# Patient Record
Sex: Male | Born: 1935
Health system: Southern US, Community
[De-identification: ages and names within clinical notes are randomized; demographics above are authoritative.]

## PROBLEM LIST (undated history)

## (undated) DIAGNOSIS — K921 Melena: Secondary | ICD-10-CM

## (undated) DIAGNOSIS — N059 Unspecified nephritic syndrome with unspecified morphologic changes: Secondary | ICD-10-CM

## (undated) DIAGNOSIS — F1010 Alcohol abuse, uncomplicated: Secondary | ICD-10-CM

## (undated) DIAGNOSIS — K635 Polyp of colon: Secondary | ICD-10-CM

## (undated) HISTORY — DX: Polyp of colon: K63.5

## (undated) HISTORY — PX: LIGAMENT REPAIR: SHX5444

## (undated) HISTORY — DX: Unspecified nephritic syndrome with unspecified morphologic changes: N05.9

## (undated) HISTORY — DX: Melena: K92.1

## (undated) HISTORY — PX: KNEE ARTHROSCOPY W/ MENISCAL REPAIR: SHX1877

## (undated) HISTORY — DX: Alcohol abuse, uncomplicated: F10.10

## (undated) HISTORY — PX: OTHER SURGICAL HISTORY: SHX169

---

## 1998-06-18 ENCOUNTER — Ambulatory Visit (HOSPITAL_COMMUNITY): Admission: RE | Admit: 1998-06-18 | Discharge: 1998-06-18 | Payer: Self-pay | Admitting: *Deleted

## 1999-05-30 HISTORY — PX: INGUINAL HERNIA REPAIR: SHX194

## 1999-08-13 ENCOUNTER — Inpatient Hospital Stay (HOSPITAL_COMMUNITY): Admission: EM | Admit: 1999-08-13 | Discharge: 1999-08-19 | Payer: Self-pay | Admitting: *Deleted

## 2000-10-10 ENCOUNTER — Ambulatory Visit (HOSPITAL_BASED_OUTPATIENT_CLINIC_OR_DEPARTMENT_OTHER): Admission: RE | Admit: 2000-10-10 | Discharge: 2000-10-10 | Payer: Self-pay | Admitting: General Surgery

## 2001-05-02 ENCOUNTER — Ambulatory Visit (HOSPITAL_COMMUNITY): Admission: RE | Admit: 2001-05-02 | Discharge: 2001-05-02 | Payer: Self-pay | Admitting: *Deleted

## 2001-05-02 ENCOUNTER — Encounter (INDEPENDENT_AMBULATORY_CARE_PROVIDER_SITE_OTHER): Payer: Self-pay | Admitting: Specialist

## 2003-08-13 ENCOUNTER — Ambulatory Visit (HOSPITAL_COMMUNITY): Admission: RE | Admit: 2003-08-13 | Discharge: 2003-08-13 | Payer: Self-pay | Admitting: Orthopedic Surgery

## 2004-01-01 ENCOUNTER — Encounter: Admission: RE | Admit: 2004-01-01 | Discharge: 2004-01-01 | Payer: Self-pay | Admitting: Internal Medicine

## 2004-06-16 ENCOUNTER — Encounter: Admission: RE | Admit: 2004-06-16 | Discharge: 2004-06-16 | Payer: Self-pay | Admitting: General Surgery

## 2004-06-22 ENCOUNTER — Encounter (INDEPENDENT_AMBULATORY_CARE_PROVIDER_SITE_OTHER): Payer: Self-pay | Admitting: *Deleted

## 2004-06-22 ENCOUNTER — Ambulatory Visit (HOSPITAL_BASED_OUTPATIENT_CLINIC_OR_DEPARTMENT_OTHER): Admission: RE | Admit: 2004-06-22 | Discharge: 2004-06-22 | Payer: Self-pay | Admitting: General Surgery

## 2004-06-22 ENCOUNTER — Ambulatory Visit (HOSPITAL_COMMUNITY): Admission: RE | Admit: 2004-06-22 | Discharge: 2004-06-22 | Payer: Self-pay | Admitting: General Surgery

## 2005-05-29 HISTORY — PX: ROTATOR CUFF REPAIR: SHX139

## 2005-07-27 DIAGNOSIS — K635 Polyp of colon: Secondary | ICD-10-CM

## 2005-07-27 HISTORY — DX: Polyp of colon: K63.5

## 2006-08-22 ENCOUNTER — Ambulatory Visit: Payer: Self-pay | Admitting: Internal Medicine

## 2006-09-10 ENCOUNTER — Ambulatory Visit: Payer: Self-pay | Admitting: Internal Medicine

## 2007-02-27 ENCOUNTER — Ambulatory Visit: Payer: Self-pay | Admitting: Internal Medicine

## 2007-06-24 ENCOUNTER — Encounter: Payer: Self-pay | Admitting: Internal Medicine

## 2007-06-24 DIAGNOSIS — Z860100 Personal history of colon polyps, unspecified: Secondary | ICD-10-CM | POA: Insufficient documentation

## 2007-06-24 DIAGNOSIS — L0591 Pilonidal cyst without abscess: Secondary | ICD-10-CM | POA: Insufficient documentation

## 2007-09-11 ENCOUNTER — Ambulatory Visit: Payer: Self-pay | Admitting: Internal Medicine

## 2008-02-21 ENCOUNTER — Ambulatory Visit: Payer: Self-pay | Admitting: Internal Medicine

## 2008-05-27 ENCOUNTER — Encounter: Payer: Self-pay | Admitting: Internal Medicine

## 2008-09-21 ENCOUNTER — Ambulatory Visit: Payer: Self-pay | Admitting: Internal Medicine

## 2008-09-21 DIAGNOSIS — R972 Elevated prostate specific antigen [PSA]: Secondary | ICD-10-CM | POA: Insufficient documentation

## 2008-09-21 LAB — CONVERTED CEMR LAB: PSA, Free: 0.7 ng/mL

## 2008-09-26 ENCOUNTER — Encounter: Payer: Self-pay | Admitting: Internal Medicine

## 2009-01-27 ENCOUNTER — Encounter: Payer: Self-pay | Admitting: Internal Medicine

## 2009-03-09 ENCOUNTER — Ambulatory Visit: Payer: Self-pay | Admitting: Internal Medicine

## 2009-03-11 ENCOUNTER — Ambulatory Visit: Payer: Self-pay | Admitting: Internal Medicine

## 2009-03-11 ENCOUNTER — Telehealth (INDEPENDENT_AMBULATORY_CARE_PROVIDER_SITE_OTHER): Payer: Self-pay | Admitting: *Deleted

## 2009-05-07 ENCOUNTER — Encounter: Payer: Self-pay | Admitting: Internal Medicine

## 2009-09-22 ENCOUNTER — Ambulatory Visit: Payer: Self-pay | Admitting: Internal Medicine

## 2009-09-22 DIAGNOSIS — IMO0002 Reserved for concepts with insufficient information to code with codable children: Secondary | ICD-10-CM | POA: Insufficient documentation

## 2009-09-24 ENCOUNTER — Telehealth: Payer: Self-pay | Admitting: Internal Medicine

## 2009-09-27 ENCOUNTER — Ambulatory Visit: Payer: Self-pay | Admitting: Internal Medicine

## 2010-02-18 ENCOUNTER — Ambulatory Visit: Payer: Self-pay | Admitting: Internal Medicine

## 2010-06-28 NOTE — Progress Notes (Signed)
    Immunizations Administered:  Tetanus Vaccine:    Vaccine Type: Td    Site: right deltoid    Mfr: Sanofi Pasteur    Dose: 0.5 ml    Route: IM    Given by: Ami Bullins CMA    Exp. Date: 06/11/2011    Lot #: [id number]    VIS given: 04/16/07 version given September 24, 2009.

## 2010-06-28 NOTE — Assessment & Plan Note (Signed)
Summary: FLU / MEN /NWS   Nurse Visit   Allergies: No Known Drug Allergies  Orders Added: 1)  Flu Vaccine 3yrs + MEDICARE PATIENTS [Q2039] 2)  Administration Flu vaccine - MCR [G0008]      Flu Vaccine Consent Questions     Do you have a history of severe allergic reactions to this vaccine? no    Any prior history of allergic reactions to egg and/or gelatin? no    Do you have a sensitivity to the preservative Thimersol? no    Do you have a past history of Guillan-Barre Syndrome? no    Do you currently have an acute febrile illness? no    Have you ever had a severe reaction to latex? no    Vaccine information given and explained to patient? yes    Are you currently pregnant? no    Lot Number:[id number]   Exp Date:11/26/2010   Site Given  Left Deltoid IMu  

## 2010-06-28 NOTE — Assessment & Plan Note (Signed)
Summary: YEARLY FU/ MAY NEED RX TO GO ELSE WHERE FOR LABS/NWS  #   Vital Signs:  Patient profile:   76 year old male Height:      67 inches Weight:      180 pounds BMI:     28.29 O2 Sat:      96 % on Room air Temp:     98.1 degrees F oral Pulse rate:   66 / minute BP sitting:   128 / 80  (left arm) Cuff size:   large  Vitals Entered By: Bill Salinas CMA (September 22, 2009 10:03 AM)  O2 Flow:  Room air CC: pt here for cpx, Pt states he had pneumonia shot about 8 years ago. Pt is due for tetanus shot and has never had shingles vaccine/ ab  Vision Screening:      Vision Comments: Eye exam at the begining of April With Dr  Nile Riggs normal exam and repeat eye exam in 1 year/ Ami Bullins CMA  September 22, 2009 10:05 AM    Primary Care Provider:  Norins  CC:  pt here for cpx and Pt states he had pneumonia shot about 8 years ago. Pt is due for tetanus shot and has never had shingles vaccine/ ab.  History of Present Illness: Patinet presents for routine follow-up. He did have lab work at the [place] in December '10 - results reviewed - all normal. IN the interval he sustained tear of medial meniscus right knee requiring arthroscopic repair by Dr. Yisroel Ramming September '10. He has done well, doing his own physical therapy. He did not resume golf until December. He has otherwise been doing well.   Current Medications (verified): 1)  Multivitamins   Tabs (Multiple Vitamin) .... Take 1 By Mouth Qd 2)  Adult Aspirin Ec Low Strength 81 Mg  Tbec (Aspirin) .... 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 Also s/p beast cancer '07.  Risk Factors: Alcohol Use: 0 (09/21/2008) Caffeine Use: 3 cups/day (09/21/2008) Diet: heart healthy (09/21/2008) Exercise: yes (09/21/2008)  Risk Factors: Smoking Status: never (09/21/2008) Passive Smoke Exposure: no (Oct 02, 2007)  Social History: [employer] BS degree. 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 Also s/p beast cancer '07.  Review of Systems  The patient denies anorexia, fever, weight loss, weight gain, vision loss, hoarseness, syncope, peripheral edema, headaches, abdominal pain, and melena.    Physical Exam  General:  WNWD very trim appearing white male looking younger than his stated age. Head:  Normocephalic and atraumatic without obvious abnormalities. No apparent alopecia or balding. Eyes:  No corneal or conjunctival inflammation noted. EOMI. Perrla. Funduscopic exam benign, without hemorrhages, exudates  or papilledema. Vision grossly normal. Ears:  External ear exam shows no significant lesions or deformities.  Otoscopic examination reveals clear canals, tympanic membranes are intact bilaterally without bulging, retraction, inflammation or discharge. Hearing is grossly normal bilaterally. Nose:  no external deformity and no external erythema.   Mouth:  Oral mucosa and oropharynx without lesions or exudates.  Teeth in good repair. Neck:  No deformities, masses,  or tenderness noted.no thyromegaly and no carotid bruits.   Chest Wall:  No deformities, masses, tenderness or gynecomastia noted. Lungs:  Normal respiratory effort, chest expands symmetrically. Lungs are clear to auscultation, no crackles or wheezes. Heart:  Normal rate and regular rhythm. S1 and S2 normal without gallop, murmur, click, rub or other extra sounds. Abdomen:  soft, non-tender, normal bowel sounds, no guarding, no hepatomegaly, and no splenomegaly.   Rectal:  No external abnormalities noted. Normal sphincter tone. No rectal masses or tenderness. Genitalia:  Testes bilaterally descended without nodularity, tenderness or masses. No scrotal masses or lesions. No penis lesions or urethral discharge. Prostate:  Prostate gland firm and smooth, no enlargement, nodularity, tenderness, mass, asymmetry or induration. Msk:  normal ROM, no joint tenderness, no joint swelling, no joint warmth, no redness over joints, no joint deformities, and no joint instability.   Pulses:  2+ radial, femoral and DP pulses Extremities:  No clubbing, cyanosis, edema, or deformity noted with normal full range of motion of all joints.   Neurologic:  alert & oriented X3, cranial nerves II-XII intact, strength normal in all extremities, sensation intact to light touch, sensation intact to pinprick, gait normal, and DTRs symmetrical and normal.   Skin:  turgor normal, color normal, no rashes, and no suspicious lesions.   Cervical Nodes:  no anterior cervical adenopathy and no posterior cervical adenopathy.   Inguinal Nodes:  no R inguinal adenopathy and no L inguinal adenopathy.   Psych:  Oriented X3, memory intact for recent and remote, normally interactive, good eye contact, and not anxious appearing.     Impression & Recommendations:  Problem # 1:  Hx of TEAR MEDIAL CARTILAGE OR MENISCUS KNEE CURRENT (ICD-836.0) Complete recovery and back on the links.  Problem # 2:  Preventive Health Care  (ICD-V70.0) Unremarkable history with no illnes. Normal physical exam. He is current with colorectal and prostate cancer screening. His labs from the [place] reviewed: excellent with normal chemistry, blood sugar, LDL cholesterol 119, HDL choleterol in the 40's, PSA normal.   In summary - a very nice man, friend of Dr. Leitha Bleak, who is medical stable and doing well. He will return in 1 year or as needed.   Complete Medication List: 1)  Multivitamins Tabs (Multiple vitamin) .... Take 1 by mouth qd 2)  Adult Aspirin Ec Low Strength 81 Mg Tbec (Aspirin) .... Take 1 by mouth qd

## 2010-09-26 ENCOUNTER — Ambulatory Visit: Payer: Self-pay | Admitting: Internal Medicine

## 2010-10-03 ENCOUNTER — Encounter: Payer: Self-pay | Admitting: Internal Medicine

## 2010-10-05 ENCOUNTER — Ambulatory Visit (INDEPENDENT_AMBULATORY_CARE_PROVIDER_SITE_OTHER): Payer: Medicare HMO | Admitting: Internal Medicine

## 2010-10-05 ENCOUNTER — Encounter: Payer: Self-pay | Admitting: Internal Medicine

## 2010-10-05 VITALS — BP 120/62 | HR 97 | Temp 97.4°F | Wt 170.0 lb

## 2010-10-05 DIAGNOSIS — Z136 Encounter for screening for cardiovascular disorders: Secondary | ICD-10-CM

## 2010-10-05 NOTE — Progress Notes (Signed)
Subjective:    Patient ID: Christopher Dudley, male    DOB: December 11, 1934, 76 y.o.   MRN: [id number]  HPI  The patient is here for annual Medicare wellness examination and management of other chronic and acute problems. In the interval since his last visit he has been doing well. He did injure his back caring for his wife who had a pelvic fracture. He was attended by [facility] orthopedics: Meloxciam x 10 day  and PT. He continues to do exercises at home. He has not played golf since her fracture and his injury. He did have an annual exam at the [place] in December '11 including a prostate exam. He had full labs which by his report were normal (copy being searched).   The risk factors are reflected in the social history.  The roster of all physicians providing medical care to patient - is listed in the Snapshot section of the chart.  Activities of daily living:  The patient is 100% inedpendent in all ADLs: dressing, toileting, feeding as well as independent mobility  Home safety : The patient has smoke detectors in the home. They wear seatbelts. Firearms are present in the home, kept in a safe fashion. There is no violence in the home.   There is no risks for hepatitis, STDs or HIV. There is no   history of blood transfusion. They have no travel history to infectious disease endemic areas of the world.  The patient has seen their dentist in the last six month. They have seen their eye doctor in the last year. They deny any hearing difficulty and have not had audiologic testing in the last year.  They do not  have excessive sun exposure. Discussed the need for sun protection: hats, long sleeves and use of sunscreen if there is significant sun exposure.   Diet: the importance of a healthy diet is discussed. They do have a healthy (unhealthy-high fat/fast food) diet.  Exercise: he does work out and plays golf several times a week.   Past Medical History  Diagnosis Date  . Colonic polyp 3-07  . Blood in  stool     hx of  . Alcohol abuse     hx of  . 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 Inguinal hernia repair 2001    bilateral, left done in 2006  . Rotator cuff repair 07  . Pilondial cyst excision 76 yrs old   Family History  Problem Relation Age of Onset  . Cancer Mother     ovarian  . Coronary artery disease Father   . Heart attack Father   . Gallbladder disease Father   . Hypertension Father   . Cancer Father     prostate  . Diabetes Neg Hx    History   Social History  . Marital Status: Married    Spouse Name: N/A    Number of Children: N/A  . Years of Education: 16   Occupational History  .      retired   Social History Main Topics  . Smoking status: Never Smoker   . Smokeless tobacco: Never Used  . Alcohol Use: Yes  . Drug Use: No  . Sexually Active: Not Currently   Other Topics Concern  . Not on file   Social History Narrative   [employer] BS degree.  [employer] x 4 years.   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 married '64 -  1 year divorce; married '70.  1 son - premature, perinatal death.  Wife with progressive dementia/confusion. Also s/p beast cancer '07.        His wife has had a pubic ramus fracture as noted from which she is making a recovery.       Review of Systems Review of Systems  Constitutional:  Negative for fever, chills, activity change and unexpected weight change.  HENT:  Negative for hearing loss, ear pain, congestion, neck stiffness and postnasal drip.   Eyes: Negative for pain, discharge and visual disturbance.  Respiratory: Negative for chest tightness and wheezing.   Cardiovascular: Negative for chest pain and palpitations.       [No decreased exercise tolerance Gastrointestinal: [No change in bowel habit. No bloating or gas. No reflux or indigestion Genitourinary: Negative for urgency, frequency, flank pain and difficulty urinating.  Musculoskeletal: Negative for  myalgias, back pain, arthralgias and gait problem.  Neurological: Negative for dizziness, tremors, weakness and headaches.  Hematological: Negative for adenopathy.  Psychiatric/Behavioral: Negative for behavioral problems and dysphoric mood.       Objective:   Physical Exam Constitutional: He is oriented to person, place, and time. He appears well-developed and well-nourished.       Healthy appearing white male in no acute distress  HENT:  Head: Normocephalic and atraumatic.  Right Ear: External ear normal. EAC/TM nl, TM is thickened. Left Ear: External ear normal.  EAC/TM nl Nose: Nose normal.  Mouth/Throat: Oropharynx is clear and moist.  Eyes: Conjunctivae and EOM are normal. Pupils are equal, round, and reactive to light. Right eye exhibits no discharge. Left eye exhibits no discharge. No scleral icterus.  Neck: Normal range of motion. Neck supple. No JVD present. No tracheal deviation present. No thyromegaly present.  Cardiovascular: Normal rate, regular rhythm and normal heart sounds.  Exam reveals no gallop and no friction rub.   No murmur heard.      Quiet precordium. 2+ radial and DP pulses  Pulmonary/Chest: Effort normal. No respiratory distress. He has no wheezes. He has no rales. He exhibits no tenderness.       No chest wall deformity  Abdominal: Soft. Bowel sounds are normal. He exhibits no distension. There is no tenderness. There is no rebound and no guarding.       No heptosplenomegaly  Genitourinary: Deferred - normal PSA at [facility]     Musculoskeletal: Normal range of motion. He exhibits no edema and no tenderness.       Small and large joints without redness, synovial thickening or deformity. Full range of motion preserved about all small, median and large joints.  Lymphadenopathy:    He has no cervical adenopathy.  Neurological: He is alert and oriented to person, place, and time. He has normal reflexes. No cranial nerve deficit. Coordination normal.  Skin: Skin is  warm and dry. No rash noted. No erythema.  Psychiatric: He has a normal mood and affect. His behavior is normal. Thought content normal.  Mental status : oriented person place and context, normal cognitive function with no evidence of memory loss or impairment.         Assessment & Plan:  1. Health maintenance - interval history without serious medical injury and making a good recovery from  Low back strain. His physical exam is normal. Outside lab review: CBC nl; Glucose 102; Cr 0.8; Lipid - 189/51/119; A1C 5.6%; PSA 2.26; TSH 0.68. All results in normal range.  Last colonoscopy '06. Immunizations are current.  In summary - a very nice man who is medically stable and doing well. He is advised to continue his back exercise and to return to playing golf when stable.

## 2010-10-06 ENCOUNTER — Encounter: Payer: Self-pay | Admitting: Internal Medicine

## 2010-10-14 NOTE — Assessment & Plan Note (Signed)
[facility]                           PRIMARY CARE OFFICE NOTE   NAME:Dudley, Christopher                    MRN:          [id number]  DATE:08/22/2006                            DOB:          19-Apr-1935    Christopher Dudley is a 76 year old gentleman who presents to establish for  ongoing continuity care. He had been formally followed by Dr. Karilyn Cota,  who has closed his office and prior to that, by Dr. Shonna Chock.   The patient has no active or chief complaint at today's visit.   PAST SURGICAL HISTORY:  1. Pilonidal cyst surgery at age 65.  2. Herniorrhaphy in 2001.  3. Left herniorrhaphy in 2006.  4. Orthopedic surgical repair of a rotator cuff in 2007.   PAST MEDICAL HISTORY:  1. Usual childhood disease.  2. Bright's disease as a child.  3. History of blood in the stool.  4. History of colon polyp.  5. History of alcohol abuse with the patient's date of sobriety being      August 12, 1999, please see referenced records for Dr. Viviann Spare and      Dr. Karilyn Cota.   CURRENT MEDICATIONS:  1. Aspirin only.  2. Multivitamins.   HABITS:  Tobacco none. Alcohol none.   FAMILY HISTORY:  Father at age 12 of an MI. He has a history of gall  bladder disease. Mother died at age 20 of ovarian cancer. Father had  prostate cancer and hypertension. No family history for colon cancer,  lung cancer, or diabetes.   SOCIAL HISTORY:  The patient has a BS degree. He graduated from  [employer]. He served for four years in the [employer]. The patient  worked for [employer] for 34 years and was a Transport planner in  transportation. The patient was married for less than a year and  divorced. He has been married to his current wife for 38 years. He is  currently retired and reports that he is enjoying his retirement.   REVIEW OF SYSTEMS:  The patient had no fevers, sweats, chills, or other  constitutional problems. He has not had an eye exam for 18-24 months  and  is due for the same. There are no ENT or cardiovascular complaints. The  patient does have a cough.  GI: The patient does have occasional heartburn. He did have a  colonoscopy, most recently in March 2007 with diverticulosis, and non-  bleeding internal hemorrhoids. The patient does admit to nocturia x2 and  some mild BPH symptoms with a slowed stream.  There are no musculoskeletal, dermatologic, neurologic, or psychiatric  complaints.   PHYSICAL EXAMINATION:  VITAL SIGNS:  Temperature 97.2, blood pressure  123/80, pulse 62, weight 191.  GENERAL:  This is a well nourished gentleman in no acute distress. No  examination otherwise conducted.   LABORATORY DATA:  This patient to be stable with no active medical  problems. He has had recent laboratory by Dr. Karilyn Cota from 07/14/05 which  revealed a normal white count, hemoglobin 14.6 grams, PSA was 2.22,  thyroid function was normal with a TSH of 1.18. Chemistries were  unremarkable with a glucose of 110, creatinine 1.0, normal electrolytes  otherwise, and normal liver functions.   ASSESSMENT AND PLAN:  Christopher Dudley seems to be a healthy gentleman. He  would be due for annual laboratory. We would need to have a lipid panel.  He is current with colorectal cancer screening. The patient seems to  have made a good recovery from his rotator cuff surgery.   The patient is asked to return to see me for a consolidation visit in 2-  3 weeks.     Rosalyn Gess Norins, MD  Electronically Signed    MEN/MedQ  DD: 09/02/2006  DT: 09/03/2006  Job #: [id number]   cc:   Radene Gunning

## 2010-10-14 NOTE — Procedures (Signed)
[facility]  Patient:    Christopher Dudley, Christopher Dudley                    MRN: [id number] Proc. Date: 08/18/99 Adm. Date:  [id number] Attending:  Alva Garnet CC:         Merlene Laughter. Renae Gloss, M.D.             Charolett Bumpers III, M.D.                           Procedure Report  PROCEDURES: 1. Video upper endoscopy. 2. Video colonoscopy.  INDICATIONS:  A 76 year old male with post GI bleeding anemia and history of alcohol abuse.  PREPARATION:  He was n.p.o. since midnight, having been on clear liquid diet and having taken GoLYTELY prep a few days ago.  The mucosa was largely clean, but there was some liquid stool present, which had to be aspirated to clear the mucosa.  PREPROCEDURE SEDATION:  He received a total of 3.75 mg of droperidol, 50 mg of Demerol and 8 mg Versed intravenously.  In addition, his throat was anesthetized with Hurricaine spray and he was on 2 L of nasal cannula O2.  PROCEDURE:  The Olympus video upper endoscope was inserted via the mouth and advanced easily through the upper esophageal sphincter.  Intubation was then carried out well into the descending duodenum.  On withdrawal, the mucosa was carefully evaluated.  Descending duodenum and bulb appeared normal.  There was ild to moderate gastritis in the antrum and especially the fundus of the stomach, but also noted diffusely.  A CLOtest was taken from the antrum.  Retroflexed view of the GE junction did not demonstrate any varices or significant hiatal hernia. he esophagus was normal without any evidence of varices.  There was no sign of upper GI active bleeding.  At the conclusion of this procedure, the patients position  was reversed for procedure #2: video colonoscopy.  He required no additional sedation.  The Olympus video colonoscope was inserted via the rectum and advanced very easily all the way to the cecum.  Cecal landmarks were identified and photographed.   On withdrawal, the mucosa was carefully evaluated.  The entire right colon was normal.  There was no sign of any active bleeding.  There was mild left-sided diverticulosis.  At 35 cm was a 0.6 cm sessile polyp noted that was removed with electrosnare cautery.  There was no post polypectomy bleeding. The specimen was recovered for histologic evaluation.  The remainder of the sigmoid and retroflexed view of the rectum were unremarkable.  Patient tolerated both procedures well.  Pulse, blood pressure and oximetry testing were stable throughout.  He was observed in recovery for 45 minutes and discharged back to is hospital room.  IMPRESSION: 1. No signs of active bleeding. 2. Gastritis, which was likely secondary to alcohol abuse.  CLOtest was pending.    There was no sign of variceal bleeding. 3. Mild left-sided diverticulosis of the colon. 4. Small sigmoid polyp, which has been removed.  PLAN:  Further recommendation as per primary attending and Dr. Laural Benes.  I will  begin the patient on a regular diet. CLOtest will be reviewed. DD:  08/18/99 TD:  08/18/99 Job: [id number]

## 2010-10-14 NOTE — Op Note (Signed)
[place]  Patient:    Christopher Dudley, Christopher Dudley                    MRN: [id number] Adm. Date:  [id number] Attending:  Henrene Dodge CC:         Lilly Cove, M.D.   Operative Report  PREOPERATIVE DIAGNOSIS:  Right inguinal hernia.  POSTOPERATIVE DIAGNOSIS:  Right inguinal hernia direct with indirect component.  OPERATION PERFORMED:  SURGEON:  Anselm Pancoast. Zachery Dakins, M.D.  ASSISTANT:  Nurse.  ANESTHESIA:  Local with sedation.  INDICATIONS FOR PROCEDURE:  The patient is a 76 year old Caucasian male who was referred to me by Dr. Karilyn Cota whom he has recently started seeing for his medical care.  The patient stated that about two weeks ago after playing golf, he noticed a little pain while playing golf and then a little bulge in the right groin shortly afterwards and over the last several weeks this has increased in size so now he has sort of an egg-sized mass that I thought was a direct inguinal hernia.  The patient has no definite weakness on the left side and I recommended that we repair this with local and sedation.  The patient was taken to the operative suite, was given a gram of Kefzol.  An IV had been started.  He was given some sedation.  The right groin area was first clipped and then prepped with Betadine surgical solution and scrubbed and draped in a sterile manner.  The inguinal incision area was infiltrated with a mixture of 0.5% plain Xylocaine and 0.25% Marcaine with Adrenalin and the ilioinguinal nerve area was infiltrated with a blunted 21 gauge needle, all total of about 30 cc of solution used.   Sharp dissection of the skin and subcutaneous Scarpas superficial vein was clamped, divided and ligated with fine Vicryl. The external oblique was then opened through the directions of the fibers. The cord structures were elevated at the symphysis pubis and you could see that this was kind of a bulge coming out through the  internal ring but on removing the little lipoma first, then noted that the patient has a very thin recent hernia sac with a lot of kind of preperitoneal tissue kind of coming out through the area.  The preperitoneal tissue was separated from the hernia sac which had been opened, high sac ligation performed with a  2-0 Vicryl, a 2-0 Prolene and then the floor of the inguinal canal which was definitely giving way and this was basically kind of a direct type weakness and was repaired with sort of a Shouldice type reinforcement of the area with running 2-0 Prolene.  I then took a piece of Prolene mesh shaped like a sail, slit laterally and sutured it in, reinforcing the floor starting at the symphysis pubis with a running 2-0 Prolene, the two tails laterally sutured together. The superior flap was sutured with interrupted 2-0 Prolene sutures.  The ilioinguinal nerve had been protected and is not under excessive tension.  The cord structures were back in normal position and the new created internal ring should not cause any cord compression and the external oblique was closed with 3-0 Vicryl.  Scarpas fascia was closed with interrupted 3-0 Vicryl, 4-0 Vicryl suture subcuticular and benzoin and Steri-Strips on the skin.  The patient tolerated the procedure nicely and was sent to the recovery room in a stable postoperative condition. He will be released after a short stay in the recovery room.  DESCRIPTION OF PROCEDURE: DD:  10/10/00 TD:  10/10/00 Job: [id number]

## 2010-10-14 NOTE — Assessment & Plan Note (Signed)
[facility]                           PRIMARY CARE OFFICE NOTE   NAME:Christopher Dudley, Rewerts                    MRN:          [id number]  DATE:09/10/2006                            DOB:          [birth year]    Mr. Goth was seen as a new patient August 22, 2006.  Please see that  complete dictation.  This was reviewed with the patient in detail with  no addition to corrections.  Of note, on orthopedics, at the time of his  rotator cuff surgery, he was also noted to have a separated biceps  tendon on the right, which was not repairable.   REVIEW OF SYSTEMS:  Negative for constitutional, cardiovascular,  respiratory, GI or GU problems.   EXAMINATION:  Temperature was 96.9, blood pressure 136/81, pulse 63,  weight 192.  GENERAL APPEARANCE:  This is a well-nourished, athletic appearing  gentleman in no acute distress.  HEENT:  Normocephalic, atraumatic.  EACs and TMs were normal. Oropharynx  with native dentition and in good repair.  No buccal or palate lesions  were noted.  Posterior pharynx was clear.  Conjunctivae and sclerae were  clear.  PERRLA, EOMI.  Funduscopic exam was unremarkable by hand held  instrument.  NECK:  Was supple without thyromegaly.  NODES:  No adenopathy was noted in the cervical, supraclavicular  regions.  CHEST:  No CVA tenderness.  LUNGS:  Were clear to auscultation and percussion.  CARDIOVASCULAR:  2+ radial pulses, no JVD or carotid bruits.  He had a  quiet precordium with regular rate and rhythm without murmurs, rubs or  gallops.  ABDOMEN:  Soft, no guarding or rebound.  No organosplenomegaly was  appreciated.  GENITALIA:  Normal uncircumcised male phallus.  Bilaterally descended  testicles without masses.  RECTAL:  Normal sphincter tone was noted.  Prostate was smooth, round,  normal contour, generous in size, but no nodules or abnormalities  otherwise noted.  EXTREMITIES:  Without clubbing, cyanosis, edema, deformity.  NEUROLOGIC:  Exam was nonfocal.   The patient did have recent laboratory by Dr. Karilyn Cota and there is no  indication to repeat laboratory at this time.   ASSESSMENT AND PLAN:  This is a healthy gentleman who appears to be  medically stable at this time.  I have asked him to return to see me on  an as needed basis. He reports that he does occasionally attend he [facility], and I have asked if he has laboratory to have copies forwarded  to me.  The patient is asked to return to see me in 1 year or on an as  needed basis.    Rosalyn Gess Norins, MD  Electronically Signed   MEN/MedQ  DD: 09/10/2006  DT: 09/11/2006  Job #: [id number]

## 2010-10-14 NOTE — Op Note (Signed)
Christopher Dudley, Christopher Dudley             ACCOUNT NO.:  [id number]   MEDICAL RECORD NO.:  [id number]          PATIENT TYPE:  AMB   LOCATION:  [facility]                         FACILITY:  [facility]   PHYSICIAN:  Anselm Pancoast. Weatherly, M.D.DATE OF BIRTH:  Jan 19, 1935   DATE OF PROCEDURE:  06/22/2004  DATE OF DISCHARGE:                                 OPERATIVE REPORT   PREOPERATIVE DIAGNOSIS:  Left inguinal hernia, probably direct.   POSTOPERATIVE DIAGNOSIS:  Left indirect inguinal hernia.   ANESTHESIA:  Local anesthesia with MAC.   SURGEON:  Anselm Pancoast. Zachery Dakins, M.D.   HISTORY OF PRESENT ILLNESS:  Christopher Dudley is a 76 year old male who I  have repaired a previous right inguinal hernia years ago.  He now comes in  with a bulge in the left groin that has been increasing over a period of  approximately a year.  He had reduced this spontaneously and Dr. Karilyn Cota is  his regular physician and he would like to go ahead and get this repaired in  the winter as he plays an extensive amount of golf and it is painful at the  end of a round.  The patient preoperatively was placed in the OR table.  The  left area clipped and then prepped with Betadine and surgical solution and  draped in a sterile manner.  The inguinal incision area was infiltrated with  a mixture of 0.5% plain Marcaine and 0.5 Xylocaine with Adrenaline and then  the ileoinguinal nerve area was anesthetized with a blunted 22-gauge needle,  in all total about 30 cc of anesthetic solution used.  Sharp dissection  through the skin and subcutaneous tissue, the external oblique was opened  and the ileoinguinal nerve was identified and protected.  The cord  structures were elevated and this was noted to be a basically an indirect  hernia with kind of a weakness of the floor and I opened the cord structures  and separated some fatty tissue in the hernia sac.  The hernia was kind of  thickened and I was not sure at first whether this was actually a  bladder  diverticulum because of the thickness and I kind of carefully dissected down  through layers, opening into the hernia sac and then a high sac ligation was  performed.  The first stitch was placed with the sac opened and then I did a  second stitch around it and then was dissecting the hernia sac when I noted  that the vas was kind of caught up looped in this little where I went around  the hernia sac.  I removed the stitch, freed the hernia sac from the cord  structures and then resutured the high sac ligation under direct vision with  an 2-0 Surgilon.  Next, the floor where it was kind of weak inside the  direct was sutured with a running 2-0 Prolene, recreating the internal ring  and then going back and tying the two ends together.  Next, a piece of  Prolene mesh shaped like a sail was slit and placed around the internal ring  starting at the symphysis pubis suturing  the inferior rim with interrupted  sutures of, or a continuous suture of 2-0 Prolene and the two tails sutured  together laterally.  The superior flap was sutured down with interrupted  sutures of 2-0 Prolene.  The external oblique was closed with 2-0 Vicryl.  The cord structures in their normal position, the ileoinguinal nerve had  been protected.  The Scarpa's fascia was closed with interrupted 3-0 Vicryl,  4-0 Dexon subcuticular and Benzoin and Steri-Strips on the skin.  The  patient tolerated the procedure nicely.  I had placed a little anesthetic  agent in the  inguinal ligament for immediate postoperative pain and he will be released  after a short stay in the recovery room.  He will see [place] back in the office  in approximately a week.  If he is getting any testicular swelling, he will  be downed more.      WJW/MEDQ  D:  06/22/2004  T:  06/22/2004  Job:  [id number]   cc:   Wilson Singer, M.D.  [address]  [place]  [place] [address]  Fax: [phone]

## 2010-10-14 NOTE — Discharge Summary (Signed)
. [facility]  Patient:    Christopher Dudley, Christopher Dudley                    MRN: [id number] Adm. Date:  [id number] Disc. Date: [id number] Attending:  Alva Garnet CC:         Georg Ruddle. Viviann Spare, M.D.                           Discharge Summary  DISCHARGE DIAGNOSES: 1. Rectal bleeding secondary to colonic polyps, diverticulosis. 2. Gastritis. 3. Alcohol withdrawal/delirium tremens. 4. Elevated liver function tests. 5. Hyperglycemia. 6. Acute blood loss anemia.  CONSULTANTS:  Verlin Grills, M.D., gastroenterologist.  PROCEDURES:  Esophagogastroduodenoscopy and colonoscopy.  HOSPITAL COURSE: #1 - RECTAL BLEEDING:  [name] presented after several episodes of acute bleeding with grossly bloody stools.  He had denied any abdominal pain, diarrhea, nausea, vomiting.  He was awaiting a colonoscopy and EGD over the weekend; however, on day #2 of admission, he started having alcohol withdrawal symptoms.  These symptoms included severe agitation elevated blood pressure, tachycardia, diaphoresis, delirium, and combativeness.  He required a leather four-point restraints and assistance from security to appropriately restrain him so that he would not hurt himself or others on the floor.  After 36 hours of a Librium taper and Ativan p.r.n., he returned to his baseline mental status and an EGD and colonoscopy was performed.  An EGD showed mild gastritis with no varices and a colonoscopy showed a mild left diverticulosis and a 0.6 cm polyp which was removed. Pathology results from the polyp were pending at discharge.  [name] did not require a blood transfusion during this admission as his hemoglobin remained stable.  At discharge, his hemoglobin was 10.9.  He will require a follow-up CBC at his outpatient follow-up visit.  He was placed on Prevacid as an outpatient.  #2 - ALCOHOL ABUSE, ALCOHOL WITHDRAWAL/DELIRIUM TREMENS:  As mentioned  above, [name] alcohol withdrawal symptoms resolved with the Librium taper and Ativan p.r.n.  A behavioral consult per the ______ team was requested during his admission.  However, [name] was discharged prior to their consultatio. [name] was given information on inpatient and outpatient alcohol treatment. He stated that he would follow up with his treatment as an outpatient.  Other abnormalities noted during his admission included hyperglycemia and increased liver function tests which can be attributed to his alcohol abuse.  DISPOSITION:  Upon discharge, [name] was at his baseline mental status. His tremors that were noted on admission had resolved.  He denied chest pain, shortness of breath, and he was eating and ambulating without complications.  He was hemodynamically stable and had no further episodes of rectal bleeding.  DISCHARGE MEDICATIONS:  Pepcid 20 mg one p.o. q.d.  FOLLOW-UP:  [name] will be seen by Dr. Shonna Chock within two weeks following discharge.  A CBC will need to be obtained at his follow-up visit. DD:  08/19/99 TD:  08/19/99 Job: [id number]

## 2011-02-13 ENCOUNTER — Ambulatory Visit (INDEPENDENT_AMBULATORY_CARE_PROVIDER_SITE_OTHER): Payer: Medicare HMO

## 2011-02-13 DIAGNOSIS — Z23 Encounter for immunization: Secondary | ICD-10-CM

## 2011-12-28 ENCOUNTER — Encounter: Payer: Self-pay | Admitting: Internal Medicine

## 2011-12-28 ENCOUNTER — Other Ambulatory Visit (INDEPENDENT_AMBULATORY_CARE_PROVIDER_SITE_OTHER): Payer: Medicare Other

## 2011-12-28 ENCOUNTER — Ambulatory Visit (INDEPENDENT_AMBULATORY_CARE_PROVIDER_SITE_OTHER): Payer: Medicare Other | Admitting: Internal Medicine

## 2011-12-28 VITALS — BP 140/82 | HR 72 | Temp 97.6°F | Resp 16 | Wt 183.0 lb

## 2011-12-28 DIAGNOSIS — Z0000 Encounter for general adult medical examination without abnormal findings: Secondary | ICD-10-CM

## 2011-12-28 DIAGNOSIS — G8929 Other chronic pain: Secondary | ICD-10-CM

## 2011-12-28 DIAGNOSIS — M545 Low back pain: Secondary | ICD-10-CM

## 2011-12-28 LAB — COMPREHENSIVE METABOLIC PANEL
ALT: 16 U/L (ref 0–53)
AST: 18 U/L (ref 0–37)
Chloride: 103 mEq/L (ref 96–112)
Creatinine, Ser: 0.9 mg/dL (ref 0.4–1.5)
Sodium: 141 mEq/L (ref 135–145)
Total Bilirubin: 1.4 mg/dL — ABNORMAL HIGH (ref 0.3–1.2)

## 2011-12-28 LAB — LIPID PANEL
HDL: 49 mg/dL (ref >39.00)
LDL Cholesterol: 114 mg/dL — ABNORMAL HIGH (ref 0–99)
Total CHOL/HDL Ratio: 4
Triglycerides: 110 mg/dL (ref 0.0–149.0)

## 2011-12-28 NOTE — Progress Notes (Signed)
Subjective:    Patient ID: Christopher Dudley, male    DOB: 1934-08-13, 77 y.o.   MRN: [id number]  HPI The patient is here for annual Medicare wellness examination and management of other chronic and acute problems.   The risk factors are reflected in the social history.  The roster of all physicians providing medical care to patient - is listed in the Snapshot section of the chart.  Activities of daily living:  The patient is 100% inedpendent in all ADLs: dressing, toileting, feeding as well as independent mobility  Home safety : The patient has smoke detectors in the home. Fall- home is fall safe, w/ grab rails in the bathroom. They wear seatbelts.No firearms at home. There is no violence in the home.   There is no risks for hepatitis, STDs or HIV. There is no   history of blood transfusion. They have no travel history to infectious disease endemic areas of the world.  The patient has not seen their dentist in the last six month. They have seen their eye doctor in the last year. They deny any hearing difficulty and have not had audiologic testing in the last year.  They do not  have excessive sun exposure. Discussed the need for sun protection: hats, long sleeves and use of sunscreen if there is significant sun exposure.   Diet: the importance of a healthy diet is discussed. They do have a healthy diet.  The patient has no regular exercise program.  The benefits of regular aerobic exercise were discussed.  Depression screen: there are no signs or vegative symptoms of depression- irritability, change in appetite, anhedonia, sadness/tearfullness.  Cognitive assessment: the patient manages all their financial and personal affairs and is actively engaged.   The following portions of the patient's history were reviewed and updated as appropriate: allergies, current medications, past family history, past medical history,  past surgical history, past social history  and problem list.  Vision,  hearing, body mass index were assessed and reviewed.   During the course of the visit the patient was educated and counseled about appropriate screening and preventive services including : fall prevention , diabetes screening, nutrition counseling, colorectal cancer screening, and recommended immunizations.  Past Medical History  Diagnosis Date  . Colonic polyp 3-07  . Blood in stool     hx of  . Alcohol abuse     hx of  . 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 Inguinal hernia repair 2001    bilateral, left done in 2006  . Rotator cuff repair 07  . Pilondial cyst excision 77 yrs old  . Knee arthroscopy w/ meniscal repair '10     Right. Yisroel Ramming)   Family History  Problem Relation Age of Onset  . Cancer Mother     ovarian  . Coronary artery disease Father   . Heart attack Father   . Gallbladder disease Father   . Hypertension Father   . Cancer Father     prostate  . Heart disease Father     CAD/MI-fatal  . Diabetes Neg Hx    History   Social History  . Marital Status: Married    Spouse Name: N/A    Number of Children: N/A  . Years of Education: 16   Occupational History  . business Production designer, theatre/television/film, Set designer     retired   Social History Main Topics  . Smoking status: Never Smoker   .  Smokeless tobacco: Never Used  . Alcohol Use: No     in reocver since 2001  . Drug Use: No  . Sexually Active: Not Currently   Other Topics Concern  . Not on file   Social History Narrative   [employer] BS degree.  [employer] 4 years.  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 married '64 - 1 year divorce; married '70.  1 son - premature, perinatal death.  Wife with progressive dementia/confusion. Also s/p beast cancer '07. Retired. 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 Hobby - golf. Plays the market-doing well. ACP: CPR-yes; mechanical ventilation - yes, for short term; no futile care for vegative state or irreversible disease.      Current Outpatient Prescriptions on File Prior to Visit  Medication Sig Dispense Refill  . aspirin 81 MG EC tablet Take 81 mg by mouth daily.        . multivitamin (THERAGRAN) per tablet Take 1 tablet by mouth daily.            Review of Systems Constitutional:  Negative for fever, chills, activity change and unexpected weight change.  HEENT:  Negative for hearing loss, ear pain, congestion, neck stiffness and postnasal drip. Negative for sore throat or swallowing problems. Negative for dental complaints.   Eyes: Negative for vision loss or change in visual acuity.  Respiratory: Negative for chest tightness and wheezing. Negative for DOE.   Cardiovascular: Negative for chest pain or palpitations. No decreased exercise tolerance Gastrointestinal: No change in bowel habit. No bloating or gas. No reflux or indigestion Genitourinary: Negative for urgency, frequency, flank pain and difficulty urinating.  Musculoskeletal: Negative for myalgias, back pain, arthralgias and gait problem.  Neurological: Negative for dizziness, tremors, weakness and headaches.  Hematological: Negative for adenopathy.  Psychiatric/Behavioral: Negative for behavioral problems and dysphoric mood.       Objective:   Physical Exam Filed Vitals:   12/28/11 0913  BP: 140/82  Pulse: 72  Temp: 97.6 F (36.4 C)  Resp: 16   Wt Readings from Last 3 Encounters:  12/28/11 183 lb (83.008 kg)  10/05/10 170 lb (77.111 kg)  09/22/09 180 lb (81.647 kg)   Gen'l: Well nourished well developed white male in no acute distress  HEENT: Head: Normocephalic and atraumatic. Right Ear: External ear normal. EAC/TM nl. Left Ear: External ear normal.  EAC/TM nl. Nose: Nose normal. Mouth/Throat: Oropharynx is clear and moist. Dentition - native, in good repair. No buccal or palatal lesions. Posterior pharynx clear. Eyes: Conjunctivae and sclera clear. EOM intact. Pupils are equal, round, and reactive to light. Right eye exhibits no  discharge. Left eye exhibits no discharge. Neck: Normal range of motion. Neck supple. No JVD present. No tracheal deviation present. No thyromegaly present.  Cardiovascular: Normal rate, regular rhythm, no gallop, no friction rub, no murmur heard.      Quiet precordium. 2+ radial and DP pulses . No carotid bruits Pulmonary/Chest: Effort normal. No respiratory distress or increased WOB, no wheezes, no rales. No chest wall deformity or CVAT. Abdomen: Soft. Bowel sounds are normal in all quadrants. He exhibits no distension, no tenderness, no rebound or guarding, No heptosplenomegaly  Genitourinary:  deferred Musculoskeletal: Normal range of motion. He exhibits no edema and no tenderness.       Small and large joints without redness, synovial thickening or deformity. Full range of motion preserved about all small, median and large joints.  Lymphadenopathy:    He has no cervical or supraclavicular adenopathy.  Neurological: He  is alert and oriented to person, place, and time. CN II-XII intact. DTRs 2+ and symmetrical biceps, radial and patellar tendons. Cerebellar function normal with no tremor, rigidity, normal gait and station.  Skin: Skin is warm and dry. No rash noted. Multiple areas of erythema after recent derm treatment. Psychiatric: He has a normal mood and affect. His behavior is normal. Thought content normal.   Lab Results  Component Value Date   GLUCOSE 96 12/28/2011   CHOL 185 12/28/2011   TRIG 110.0 12/28/2011   HDL 49.00 12/28/2011   LDLCALC 114* 12/28/2011   ALT 16 12/28/2011   AST 18 12/28/2011   NA 141 12/28/2011   K 4.6 12/28/2011   CL 103 12/28/2011   CREATININE 0.9 12/28/2011   BUN 14 12/28/2011   CO2 32 12/28/2011   TSH 0.88 12/28/2011   PSA 2.32 09/21/2008          Assessment & Plan:

## 2011-12-28 NOTE — Assessment & Plan Note (Signed)
Interval medical history is unremarkable. Physical exam is normal. Lab results are normal. He is current with colorectal and prostate cancer screening. Immunizations are up to date except he needs shingles vaccine per CDC guidelines.  In summary - a very nice man who is healthy. For his chronic low back pain recommended daily exercise - referred to [employer]. He is encouraged to resume regular exercise at least 3 times a week. He will return as needed or in 1 year.

## 2012-01-05 ENCOUNTER — Encounter: Payer: Self-pay | Admitting: Internal Medicine

## 2012-02-08 ENCOUNTER — Ambulatory Visit (INDEPENDENT_AMBULATORY_CARE_PROVIDER_SITE_OTHER): Payer: Medicare Other | Admitting: *Deleted

## 2012-02-08 DIAGNOSIS — Z23 Encounter for immunization: Secondary | ICD-10-CM

## 2012-12-30 ENCOUNTER — Encounter: Payer: Self-pay | Admitting: Internal Medicine

## 2012-12-30 ENCOUNTER — Ambulatory Visit (INDEPENDENT_AMBULATORY_CARE_PROVIDER_SITE_OTHER): Payer: Medicare Other | Admitting: Internal Medicine

## 2012-12-30 VITALS — BP 160/90 | HR 73 | Temp 97.9°F | Ht 66.25 in | Wt 186.0 lb

## 2012-12-30 DIAGNOSIS — Z0000 Encounter for general adult medical examination without abnormal findings: Secondary | ICD-10-CM

## 2012-12-30 DIAGNOSIS — IMO0002 Reserved for concepts with insufficient information to code with codable children: Secondary | ICD-10-CM

## 2012-12-30 DIAGNOSIS — R972 Elevated prostate specific antigen [PSA]: Secondary | ICD-10-CM

## 2012-12-30 NOTE — Progress Notes (Signed)
Subjective:    Patient ID: Christopher Dudley, male    DOB: Nov 24, 1934, 78 y.o.   MRN: [id number]  HPI The patient is here for annual Medicare wellness examination and management of other chronic and acute problems.  In the interval he has been healthy with no major medical illness or surgery.    The risk factors are reflected in the social history.  The roster of all physicians providing medical care to patient - is listed in the Snapshot section of the chart.  Activities of daily living:  The patient is 100% inedpendent in all ADLs: dressing, toileting, feeding as well as independent mobility  Home safety : The patient has smoke detectors in the home. Falls - none. Home is fall safe.  They wear seatbelts. No firearms at home. There is no violence in the home.   There is no risks for hepatitis, STDs or HIV. There is no history of blood transfusion. They have no travel history to infectious disease endemic areas of the world.  The patient has not seen their dentist in the last 24 months. Admonished to see the dentist. They have seen their eye doctor in the last year. They deny any hearing difficulty and have not had audiologic testing in the last year.    They do not  have excessive sun exposure. Discussed the need for sun protection: hats, long sleeves and use of sunscreen if there is significant sun exposure.   Diet: the importance of a healthy diet is discussed. They do have a healthy diet.  The patient has no regular exercise program: he is planning to resume going to the gym and working out on a regular basis.  The benefits of regular aerobic exercise were discussed.  Depression screen: there are no signs or vegative symptoms of depression- irritability, change in appetite, anhedonia, sadness/tearfullness.  Cognitive assessment: the patient manages all their financial and personal affairs and is actively engaged.  The following portions of the patient's history were reviewed and  updated as appropriate: allergies, current medications, past family history, past medical history,  past surgical history, past social history  and problem list.  Vision, hearing, body mass index were assessed and reviewed.   During the course of the visit the patient was educated and counseled about appropriate screening and preventive services including : fall prevention , diabetes screening, nutrition counseling, colorectal cancer screening, and recommended immunizations.  Past Medical History  Diagnosis Date  . Colonic polyp 3-07  . Blood in stool     hx of  . Alcohol abuse     hx of  . 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 Inguinal hernia repair  2001    bilateral, left done in 2006  . Rotator cuff repair  07  . Pilondial cyst excision  78 yrs old  . Knee arthroscopy w/ meniscal repair  '10     Right. Yisroel Ramming)   Family History  Problem Relation Age of Onset  . Cancer Mother     ovarian  . Coronary artery disease Father   . Heart attack Father   . Gallbladder disease Father   . Hypertension Father   . Cancer Father     prostate  . Heart disease Father     CAD/MI-fatal  . Diabetes Neg Hx    History   Social History  . Marital Status: Married    Spouse Name: N/A  Number of Children: N/A  . Years of Education: 16   Occupational History  . business Production designer, theatre/television/film, Set designer     retired   Social History Main Topics  . Smoking status: Never Smoker   . Smokeless tobacco: Never Used  . Alcohol Use: No     Comment: in reocver since 2001  . Drug Use: No  . Sexually Active: Not Currently   Other Topics Concern  . Not on file   Social History Narrative   [employer] BS degree.  [employer] 4 years.  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 married '64 - 1 year divorce; married '70.  1 son - premature, perinatal death.  Wife with progressive dementia/confusion. Also s/p beast cancer '07. Retired.  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 Hobby - golf. Plays the market-doing well. ACP: CPR-yes; mechanical ventilation - yes, for short term; no futile care for vegative state or irreversible disease.     Current Outpatient Prescriptions on File Prior to Visit  Medication Sig Dispense Refill  . aspirin 81 MG EC tablet Take 81 mg by mouth daily.        . multivitamin (THERAGRAN) per tablet Take 1 tablet by mouth daily.         No current facility-administered medications on file prior to visit.      Review of Systems Constitutional:  Negative for fever, chills, activity change and unexpected weight change.  HEENT:  Negative for hearing loss, ear pain, congestion, neck stiffness and postnasal drip. Negative for sore throat or swallowing problems. Negative for dental complaints.   Eyes: Negative for vision loss or change in visual acuity.  Respiratory: Negative for chest tightness and wheezing. Negative for DOE.   Cardiovascular: Negative for chest pain or palpitations. No decreased exercise tolerance Gastrointestinal: No change in bowel habit. No bloating or gas. No reflux or indigestion Genitourinary: Negative for urgency, frequency, flank pain and difficulty urinating.  Musculoskeletal: Negative for myalgias, back pain, arthralgias and gait problem.  Neurological: Negative for dizziness, tremors, weakness and headaches.  Hematological: Negative for adenopathy.  Psychiatric/Behavioral: Negative for behavioral problems and dysphoric mood.       Objective:   Physical Exam Filed Vitals:   12/30/12 1334  BP: 160/90  Pulse: 73  Temp: 97.9 F (36.6 C)   Wt Readings from Last 3 Encounters:  12/30/12 186 lb (84.369 kg)  12/28/11 183 lb (83.008 kg)  10/05/10 170 lb (77.111 kg)   Gen'l: Well nourished well developed white male in no acute distress  HEENT: Head: Normocephalic and atraumatic. Right Ear: External ear normal. EAC/TM nl. Left Ear: External ear normal.  EAC/TM nl. Nose: Nose normal.  Mouth/Throat: Oropharynx is clear and moist. Dentition - native, in good repair. No buccal or palatal lesions. Posterior pharynx clear. Eyes: Conjunctivae and sclera clear. EOM intact. Pupils are equal, round, and reactive to light. Right eye exhibits no discharge. Left eye exhibits no discharge. Neck: Normal range of motion. Neck supple. No JVD present. No tracheal deviation present. No thyromegaly present.  Cardiovascular: Normal rate, regular rhythm, no gallop, no friction rub, no murmur heard.      Quiet precordium. 2+ radial and DP pulses . No carotid bruits Pulmonary/Chest: Effort normal. No respiratory distress or increased WOB, no wheezes, no rales. No chest wall deformity or CVAT. Abdomen: Soft. Bowel sounds are normal in all quadrants. He exhibits no distension, no tenderness, no rebound or guarding, No heptosplenomegaly  Genitourinary:  deferred Musculoskeletal: Normal range of motion. He  exhibits no edema and no tenderness.       Small and large joints without redness, synovial thickening or deformity. Full range of motion preserved about all small, median and large joints.  Lymphadenopathy:    He has no cervical or supraclavicular adenopathy.  Neurological: He is alert and oriented to person, place, and time. CN II-XII intact. DTRs 2+ and symmetrical biceps, radial and patellar tendons. Cerebellar function normal with no tremor, rigidity, normal gait and station.  Skin: Skin is warm and dry. No rash noted. No erythema.  Psychiatric: He has a normal mood and affect. His behavior is normal. Thought content normal.   Labs from [place] are pending - by patient report they were normal.      Assessment & Plan:

## 2012-12-30 NOTE — Patient Instructions (Addendum)
Good to see you. You appear to be very healthy.  No labs today - please drop of a copy of the [place] labs.  You are up to date on everything except the shingles vaccine. [facility] at your next [facility].

## 2012-12-31 NOTE — Assessment & Plan Note (Signed)
Interval history is benign. Physical exam is normal. Labs are pending from the [place]. Immunizations are current except for Shingles vaccine which he will get at his next [place] physical.  In summary A nice man, devoted husband, who is doing well.

## 2012-12-31 NOTE — Assessment & Plan Note (Signed)
No limitations in activity - he is playing golf without limitations

## 2012-12-31 NOTE — Assessment & Plan Note (Signed)
No new labs - [facility] reports pending. He has not required GU intervention and he is asymptomatic.

## 2013-02-06 ENCOUNTER — Ambulatory Visit (INDEPENDENT_AMBULATORY_CARE_PROVIDER_SITE_OTHER): Payer: Medicare Other

## 2013-02-06 DIAGNOSIS — Z23 Encounter for immunization: Secondary | ICD-10-CM

## 2013-12-09 ENCOUNTER — Encounter: Payer: Self-pay | Admitting: Internal Medicine

## 2013-12-09 LAB — TSH: TSH: 0.705

## 2013-12-09 LAB — URINALYSIS
Bilirubin (Urine): NEGATIVE
Ketones, urine: NEGATIVE
Leukocytes, UA: NEGATIVE
PH, URINE: 7
PROTEIN UR: NEGATIVE
SPECIFIC GRAVITY, URINE: 1.011
UROBILINOGEN UA: NORMAL
Urine Glucose: NEGATIVE

## 2013-12-09 LAB — CBC WITH DIFFERENTIAL/PLATELET
BASO#: 0 /uL
BASO%: 1 %
EOS%: 0 %
EOS: 1 %
HEMATOCRIT: 46 %
HGB: 15.1 g/dL
LYMPH%: 35 %
MCH: 29
MCHC: 33.2
MCV: 87.5 fL
MONO#: 0.58
MONO%: 9 %
MPV: 10.1 fL (ref 7.5–11.5)
NEUTROPHILS RELATIVE % (KUC): 54.1 % (ref 44–76)
NEUTROS ABS: 3.55
PLATELETS: 238 10*3/uL
RBC: 5.2
RDW-CV: 13
RDW-SD: 41.5
WBC: 6.55
lymph#: 2.32

## 2013-12-09 LAB — CMP AND LIVER
ALT: 22 U/L (ref 10–40)
AST: 20 U/L
Albumin: 3.8
Alkaline Phosphatase: 69 U/L
BILIDIRFLUID: 0.2
BILIRUBIN TOTAL: 1 mg/dL
CALCIUM: 8.7 mg/dL
CHOLESTEROL: 180 mg/dL (ref 0–200)
CO2: 28 mmol/L
CREATININE: 0.9
Chloride: 106 mmol/L
Glucose: 96
HDL: 52 mg/dL (ref 35–70)
LDL Calculated: 112 mg/dL
POTASSIUM: 4.5 mmol/L
Protein: 7.5
SODIUM: 139 mmol/L (ref 137–147)
TRIGLYCERIDES: 81
UREA NITROGEN: 18

## 2014-01-05 ENCOUNTER — Encounter: Payer: Self-pay | Admitting: Internal Medicine

## 2014-01-05 ENCOUNTER — Ambulatory Visit (INDEPENDENT_AMBULATORY_CARE_PROVIDER_SITE_OTHER): Payer: Managed Care, Other (non HMO) | Admitting: Internal Medicine

## 2014-01-05 VITALS — BP 118/82 | HR 73 | Temp 98.3°F | Resp 16 | Ht 66.25 in | Wt 192.0 lb

## 2014-01-05 DIAGNOSIS — E785 Hyperlipidemia, unspecified: Secondary | ICD-10-CM

## 2014-01-05 DIAGNOSIS — Z23 Encounter for immunization: Secondary | ICD-10-CM

## 2014-01-05 NOTE — Progress Notes (Signed)
Pre visit review using our clinic review tool, if applicable. No additional management support is needed unless otherwise documented below in the visit note. 

## 2014-01-05 NOTE — Patient Instructions (Signed)

## 2014-01-05 NOTE — Progress Notes (Signed)
   Subjective:    Patient ID: Christopher Dudley, male    DOB: [birth year], 79 y.o.   MRN: [id number]  Hyperlipidemia This is a chronic problem. The current episode started more than 1 year ago. The problem is controlled. Recent lipid tests were reviewed and are variable. He has no history of chronic renal disease, diabetes, hypothyroidism, liver disease, obesity or nephrotic syndrome. Pertinent negatives include no chest pain, focal sensory loss, focal weakness, leg pain, myalgias or shortness of breath. Current antihyperlipidemic treatment includes exercise and diet change. The current treatment provides significant improvement of lipids. There are no compliance problems.       Review of Systems  Constitutional: Negative.  Negative for fever, chills, diaphoresis, appetite change and fatigue.  HENT: Negative.   Eyes: Negative.   Respiratory: Negative.  Negative for cough, choking, chest tightness, shortness of breath and stridor.   Cardiovascular: Negative.  Negative for chest pain, palpitations and leg swelling.  Gastrointestinal: Negative.  Negative for nausea, vomiting, abdominal pain, diarrhea, constipation and blood in stool.  Endocrine: Negative.   Genitourinary: Negative.   Musculoskeletal: Negative.  Negative for myalgias.  Skin: Negative.  Negative for rash.  Allergic/Immunologic: Negative.   Neurological: Negative.  Negative for focal weakness.  Hematological: Negative.  Negative for adenopathy. Does not bruise/bleed easily.  Psychiatric/Behavioral: Negative.        Objective:   Physical Exam  Vitals reviewed. Constitutional: He is oriented to person, place, and time. He appears well-developed and well-nourished. No distress.  HENT:  Head: Normocephalic and atraumatic.  Mouth/Throat: Oropharynx is clear and moist. No oropharyngeal exudate.  Eyes: Conjunctivae are normal. Right eye exhibits no discharge. Left eye exhibits no discharge. No scleral icterus.  Neck: Normal range  of motion. Neck supple. No JVD present. No tracheal deviation present. No thyromegaly present.  Cardiovascular: Normal rate, regular rhythm, normal heart sounds and intact distal pulses.  Exam reveals no gallop and no friction rub.   No murmur heard. Pulmonary/Chest: Effort normal and breath sounds normal. No stridor. No respiratory distress. He has no wheezes. He has no rales. He exhibits no tenderness.  Abdominal: Soft. Bowel sounds are normal. He exhibits no distension and no mass. There is no tenderness. There is no rebound and no guarding.  Musculoskeletal: Normal range of motion. He exhibits no edema and no tenderness.  Lymphadenopathy:    He has no cervical adenopathy.  Neurological: He is oriented to person, place, and time.  Skin: Skin is warm and dry. No rash noted. He is not diaphoretic. No erythema. No pallor.  Psychiatric: He has a normal mood and affect. His behavior is normal. Judgment and thought content normal.      Lab Results  Component Value Date   WBC 6.55 07/04/2013   HGB 15.1 07/04/2013   HCT 46 07/04/2013   PLT 238 07/04/2013   GLUCOSE 96 12/28/2011   CHOL 180 07/04/2013   TRIG 81 07/04/2013   HDL 52 07/04/2013   LDLCALC 112 07/04/2013   ALT 22 07/04/2013   AST 20 07/04/2013   NA 139 07/04/2013   K 4.5 07/04/2013   CL 106 07/04/2013   CREATININE 0.9 07/04/2013   BUN 14 12/28/2011   CO2 28 07/04/2013   TSH 0.705 07/04/2013   PSA 2.32 09/21/2008      Assessment & Plan:

## 2014-01-08 NOTE — Assessment & Plan Note (Signed)
He has achieved his LDL goal and does not want to be on a statin

## 2014-02-10 ENCOUNTER — Ambulatory Visit (INDEPENDENT_AMBULATORY_CARE_PROVIDER_SITE_OTHER): Payer: Managed Care, Other (non HMO)

## 2014-02-10 DIAGNOSIS — Z23 Encounter for immunization: Secondary | ICD-10-CM

## 2014-07-01 LAB — LIPID PANEL
CHOLESTEROL: 197 mg/dL (ref 0–200)
HDL: 54 mg/dL (ref 35–70)
LDL CALC: 129 mg/dL
TRIGLYCERIDES: 70 mg/dL (ref 40–160)

## 2014-07-01 LAB — BASIC METABOLIC PANEL
BUN: 14 mg/dL (ref 4–21)
CREATININE: 1 mg/dL (ref 0.6–1.3)
Glucose: 92 mg/dL
POTASSIUM: 5 mmol/L (ref 3.4–5.3)
SODIUM: 145 mmol/L (ref 137–147)

## 2014-07-01 LAB — HEPATIC FUNCTION PANEL
ALT: 29 U/L (ref 10–40)
AST: 16 U/L (ref 14–40)
Alkaline Phosphatase: 73 U/L (ref 25–125)
Bilirubin, Direct: 0.3 mg/dL (ref 0.01–0.4)
Bilirubin, Total: 1.3 mg/dL

## 2014-07-01 LAB — TSH: TSH: 0.61 u[IU]/mL (ref 0.41–5.90)

## 2014-07-01 LAB — CBC AND DIFFERENTIAL: WBC: 8.8 10^3/mL

## 2014-08-12 ENCOUNTER — Ambulatory Visit: Payer: Managed Care, Other (non HMO) | Admitting: Internal Medicine

## 2014-08-13 ENCOUNTER — Encounter: Payer: Self-pay | Admitting: Internal Medicine

## 2014-08-13 LAB — URINALYSIS, MANUAL ONLY
Bilirubin (Urine): NEGATIVE
Blood, UA: NEGATIVE
GLUCOSE UR: NEGATIVE
KETONES, URINE: NEGATIVE
Nitrites, Initial: NEGATIVE
Specific Gravity, Urine: 1.012
UROBILINOGEN UA: NORMAL
WBC, UA: NEGATIVE
pH, Initial: 6

## 2014-08-24 ENCOUNTER — Encounter: Payer: Self-pay | Admitting: Internal Medicine

## 2015-01-11 ENCOUNTER — Ambulatory Visit (INDEPENDENT_AMBULATORY_CARE_PROVIDER_SITE_OTHER): Payer: PPO | Admitting: Internal Medicine

## 2015-01-11 ENCOUNTER — Encounter: Payer: Self-pay | Admitting: Internal Medicine

## 2015-01-11 VITALS — BP 138/82 | HR 83 | Temp 97.6°F | Resp 16 | Ht 66.0 in | Wt 191.0 lb

## 2015-01-11 DIAGNOSIS — Z0000 Encounter for general adult medical examination without abnormal findings: Secondary | ICD-10-CM

## 2015-01-11 DIAGNOSIS — E785 Hyperlipidemia, unspecified: Secondary | ICD-10-CM | POA: Diagnosis not present

## 2015-01-11 LAB — FECAL OCCULT BLOOD, GUAIAC: Fecal Occult Blood: NEGATIVE

## 2015-01-11 NOTE — Assessment & Plan Note (Signed)

## 2015-01-11 NOTE — Progress Notes (Signed)
Pre visit review using our clinic review tool, if applicable. No additional management support is needed unless otherwise documented below in the visit note. 

## 2015-01-11 NOTE — Progress Notes (Signed)
Subjective:  Patient ID: Christopher Dudley, male    DOB: 1934/12/22  Age: 80 y.o. MRN: [id number]  CC: Hyperlipidemia and Annual Exam   HPI Christopher Dudley presents for a physical, he feels well and offers no complaints.  Outpatient Prescriptions Prior to Visit  Medication Sig Dispense Refill  . aspirin 81 MG EC tablet Take 81 mg by mouth daily.      . multivitamin (THERAGRAN) per tablet Take 1 tablet by mouth daily.       No facility-administered medications prior to visit.    ROS Review of Systems  Constitutional: Negative.  Negative for fever, chills, diaphoresis, appetite change and fatigue.  HENT: Negative.   Eyes: Negative.  Negative for visual disturbance.  Respiratory: Negative.  Negative for cough, choking, chest tightness, shortness of breath and stridor.   Cardiovascular: Negative.  Negative for chest pain, palpitations and leg swelling.  Gastrointestinal: Negative.  Negative for nausea, vomiting, abdominal pain, diarrhea, constipation and blood in stool.  Endocrine: Negative.   Genitourinary: Negative.  Negative for dysuria, hematuria, enuresis and difficulty urinating.  Musculoskeletal: Negative.  Negative for myalgias, back pain, joint swelling and arthralgias.  Skin: Negative.   Allergic/Immunologic: Negative.   Neurological: Negative.  Negative for dizziness, tremors, weakness, numbness and headaches.  Hematological: Negative.  Negative for adenopathy. Does not bruise/bleed easily.  Psychiatric/Behavioral: Negative.     Objective:  BP 138/82 mmHg  Pulse 83  Temp(Src) 97.6 F (36.4 C) (Oral)  Ht 5\' 6"  (1.676 m)  Wt 191 lb (86.637 kg)  BMI 30.84 kg/m2  SpO2 97%  BP Readings from Last 3 Encounters:  01/11/15 138/82  01/05/14 118/82  12/30/12 160/90    Wt Readings from Last 3 Encounters:  01/11/15 191 lb (86.637 kg)  01/05/14 192 lb (87.091 kg)  12/30/12 186 lb (84.369 kg)    Physical Exam  Constitutional: He is oriented to person, place, and  time. No distress.  HENT:  Head: Normocephalic and atraumatic.  Mouth/Throat: Oropharynx is clear and moist. No oropharyngeal exudate.  Eyes: Conjunctivae are normal. Right eye exhibits no discharge. Left eye exhibits no discharge. No scleral icterus.  Neck: Normal range of motion. Neck supple. No JVD present. No tracheal deviation present. No thyromegaly present.  Cardiovascular: Normal rate, regular rhythm, normal heart sounds and intact distal pulses.  Exam reveals no gallop and no friction rub.   No murmur heard. Pulmonary/Chest: Effort normal and breath sounds normal. No stridor. No respiratory distress. He has no wheezes. He has no rales. He exhibits no tenderness.  Abdominal: Soft. Bowel sounds are normal. He exhibits no distension and no mass. There is no tenderness. There is no rebound and no guarding. Hernia confirmed negative in the right inguinal area and confirmed negative in the left inguinal area.  Genitourinary: Rectum normal, testes normal and penis normal. Rectal exam shows no external hemorrhoid, no internal hemorrhoid, no fissure, no mass, no tenderness and anal tone normal. Guaiac negative stool. Prostate is not enlarged and not tender. Right testis shows no mass, no swelling and no tenderness. Right testis is descended. Left testis shows no mass, no swelling and no tenderness. Left testis is descended. Uncircumcised. No phimosis, paraphimosis, hypospadias, penile erythema or penile tenderness. No discharge found.  Musculoskeletal: Normal range of motion. He exhibits no edema or tenderness.  Lymphadenopathy:    He has no cervical adenopathy.       Right: No inguinal adenopathy present.       Left: No inguinal adenopathy  present.  Neurological: He is oriented to person, place, and time.  Skin: Skin is warm and dry. No rash noted. He is not diaphoretic. No erythema. No pallor.  Psychiatric: He has a normal mood and affect. His behavior is normal. Judgment and thought content  normal.  Vitals reviewed.   Lab Results  Component Value Date   WBC 8.8 07/01/2014   HGB 15.1 07/04/2013   HCT 46 07/04/2013   PLT 238 07/04/2013   GLUCOSE 96 12/28/2011   CHOL 197 07/01/2014   TRIG 70 07/01/2014   HDL 54 07/01/2014   LDLCALC 129 07/01/2014   ALT 29 07/01/2014   AST 16 07/01/2014   NA 145 07/01/2014   K 5.0 07/01/2014   CL 106 07/04/2013   CREATININE 1.0 07/01/2014   BUN 14 07/01/2014   CO2 28 07/04/2013   TSH 0.61 07/01/2014   PSA 2.32 09/21/2008    No results found.  Assessment & Plan:   Christopher Dudley was seen today for hyperlipidemia and annual exam.  Diagnoses and all orders for this visit:  Routine health maintenance- he refuses shingles vaccine today.  Hyperlipidemia with target LDL less than 130- he has achieved his LDL goal is doing well on the statin.   I am having Christopher Dudley maintain his aspirin and multivitamin.  No orders of the defined types were placed in this encounter.     Follow-up: No Follow-up on file.  Scarlette Calico, MD

## 2015-01-11 NOTE — Patient Instructions (Signed)

## 2015-01-28 ENCOUNTER — Ambulatory Visit (INDEPENDENT_AMBULATORY_CARE_PROVIDER_SITE_OTHER): Payer: PPO

## 2015-01-28 DIAGNOSIS — Z23 Encounter for immunization: Secondary | ICD-10-CM | POA: Diagnosis not present

## 2015-06-11 DIAGNOSIS — L821 Other seborrheic keratosis: Secondary | ICD-10-CM | POA: Diagnosis not present

## 2015-06-11 DIAGNOSIS — L570 Actinic keratosis: Secondary | ICD-10-CM | POA: Diagnosis not present

## 2015-06-11 DIAGNOSIS — L812 Freckles: Secondary | ICD-10-CM | POA: Diagnosis not present

## 2015-06-11 DIAGNOSIS — L720 Epidermal cyst: Secondary | ICD-10-CM | POA: Diagnosis not present

## 2015-06-11 DIAGNOSIS — D225 Melanocytic nevi of trunk: Secondary | ICD-10-CM | POA: Diagnosis not present

## 2015-06-11 DIAGNOSIS — D2372 Other benign neoplasm of skin of left lower limb, including hip: Secondary | ICD-10-CM | POA: Diagnosis not present

## 2015-06-30 LAB — CBC AND DIFFERENTIAL
HEMATOCRIT: 43 % (ref 41–53)
Hemoglobin: 14.5 g/dL (ref 13.5–17.5)
Neutrophils Absolute: 4 /uL
Platelets: 221 10*3/uL (ref 150–399)
WBC: 6.6 10*3/mL

## 2015-06-30 LAB — BASIC METABOLIC PANEL
BUN: 14 mg/dL (ref 4–21)
Creatinine: 1 mg/dL (ref 0.6–1.3)
GLUCOSE: 96 mg/dL
POTASSIUM: 5.5 mmol/L — AB (ref 3.4–5.3)
Sodium: 143 mmol/L (ref 137–147)

## 2015-06-30 LAB — HEPATIC FUNCTION PANEL
ALT: 21 U/L (ref 10–40)
AST: 16 U/L (ref 14–40)
Alkaline Phosphatase: 70 U/L (ref 25–125)
Bilirubin, Direct: 0.3 mg/dL (ref 0.01–0.4)
Bilirubin, Total: 1.4 mg/dL

## 2015-06-30 LAB — LIPID PANEL
Cholesterol: 166 mg/dL (ref 0–200)
HDL: 54 mg/dL (ref 35–70)
LDL CALC: 54 mg/dL
Triglycerides: 52 mg/dL (ref 40–160)

## 2015-06-30 LAB — PSA: PSA: 2.62

## 2015-07-28 ENCOUNTER — Other Ambulatory Visit: Payer: Self-pay | Admitting: Internal Medicine

## 2015-07-28 ENCOUNTER — Other Ambulatory Visit (INDEPENDENT_AMBULATORY_CARE_PROVIDER_SITE_OTHER): Payer: PPO

## 2015-07-28 ENCOUNTER — Encounter: Payer: Self-pay | Admitting: Internal Medicine

## 2015-07-28 DIAGNOSIS — E785 Hyperlipidemia, unspecified: Secondary | ICD-10-CM

## 2015-07-28 LAB — COMPREHENSIVE METABOLIC PANEL
ALBUMIN: 4.1 g/dL (ref 3.5–5.2)
ALK PHOS: 62 U/L (ref 39–117)
ALT: 16 U/L (ref 0–53)
AST: 17 U/L (ref 0–37)
BUN: 17 mg/dL (ref 6–23)
CALCIUM: 9.2 mg/dL (ref 8.4–10.5)
CO2: 29 mEq/L (ref 19–32)
CREATININE: 1.01 mg/dL (ref 0.40–1.50)
Chloride: 104 mEq/L (ref 96–112)
GFR: 75.84 mL/min (ref >60.00)
Glucose, Bld: 106 mg/dL — ABNORMAL HIGH (ref 70–99)
Potassium: 4.9 mEq/L (ref 3.5–5.1)
SODIUM: 139 meq/L (ref 135–145)
TOTAL PROTEIN: 7 g/dL (ref 6.0–8.3)
Total Bilirubin: 1 mg/dL (ref 0.2–1.2)

## 2015-08-09 LAB — URINALYSIS
Blood, UA: NEGATIVE
KETONES UA: POSITIVE — AB
LEUKOCYTES UA: NEGATIVE
Nitrite, UA: NEGATIVE
Protein, Ur: NEGATIVE
SPEC GRAV UA: 1.014
Urobilinogen, UA: NORMAL
pH, UA: 7 (ref 4.5–8.0)

## 2016-01-17 ENCOUNTER — Ambulatory Visit (INDEPENDENT_AMBULATORY_CARE_PROVIDER_SITE_OTHER): Payer: PPO | Admitting: Internal Medicine

## 2016-01-17 ENCOUNTER — Encounter: Payer: Self-pay | Admitting: Internal Medicine

## 2016-01-17 VITALS — BP 118/78 | HR 68 | Temp 97.8°F | Resp 16 | Ht 66.0 in | Wt 170.0 lb

## 2016-01-17 DIAGNOSIS — Z0000 Encounter for general adult medical examination without abnormal findings: Secondary | ICD-10-CM | POA: Diagnosis not present

## 2016-01-17 DIAGNOSIS — Z23 Encounter for immunization: Secondary | ICD-10-CM | POA: Diagnosis not present

## 2016-01-17 MED ORDER — ZOSTER VACCINE LIVE 19400 UNT/0.65ML ~~LOC~~ SUSR: 0.6500 mL | Freq: Once | SUBCUTANEOUS | 0 refills | Status: AC

## 2016-01-17 NOTE — Assessment & Plan Note (Signed)

## 2016-01-17 NOTE — Patient Instructions (Signed)

## 2016-01-17 NOTE — Progress Notes (Signed)
Subjective:  Patient ID: Christopher Dudley, male    DOB: Sep 21, 1934  Age: 81 y.o. MRN: [id number]  CC: Annual Exam   HPI Christopher Dudley presents for  CPX.  He feels well and offers no complaints. He has lost about 40 pounds over the last 9 months with exercise.  Outpatient Medications Prior to Visit  Medication Sig Dispense Refill  . aspirin 81 MG EC tablet Take 81 mg by mouth daily.      . multivitamin (THERAGRAN) per tablet Take 1 tablet by mouth daily.       No facility-administered medications prior to visit.     ROS Review of Systems  Constitutional: Negative for activity change, appetite change and unexpected weight change.  HENT: Negative.  Negative for trouble swallowing.   Eyes: Negative.  Negative for visual disturbance.  Respiratory: Negative for cough, chest tightness and shortness of breath.   Cardiovascular: Negative.  Negative for chest pain, palpitations and leg swelling.  Gastrointestinal: Negative.  Negative for abdominal pain, constipation, diarrhea, nausea and vomiting.  Endocrine: Negative.   Genitourinary: Negative.  Negative for difficulty urinating.  Musculoskeletal: Negative.  Negative for arthralgias, back pain, myalgias and neck pain.  Skin: Negative.  Negative for color change and rash.  Allergic/Immunologic: Negative.   Neurological: Negative.   Hematological: Negative.   Psychiatric/Behavioral: Negative.   All other systems reviewed and are negative.   Objective:  BP 118/78 (BP Location: Left Arm, Patient Position: Sitting, Cuff Size: Normal)   Pulse 68   Temp 97.8 F (36.6 C) (Oral)   Resp 16   Ht 5\' 6"  (1.676 m)   Wt 170 lb (77.1 kg)   SpO2 98%   BMI 27.44 kg/m   BP Readings from Last 3 Encounters:  01/17/16 118/78  01/11/15 138/82  01/05/14 118/82    Wt Readings from Last 3 Encounters:  01/17/16 170 lb (77.1 kg)  01/11/15 191 lb (86.6 kg)  01/05/14 192 lb (87.1 kg)    Physical Exam  Constitutional: He is oriented to  person, place, and time. No distress.  HENT:  Mouth/Throat: Oropharynx is clear and moist. No oropharyngeal exudate.  Eyes: Conjunctivae are normal. Right eye exhibits no discharge. Left eye exhibits no discharge. No scleral icterus.  Neck: Normal range of motion. Neck supple. No JVD present. No tracheal deviation present. No thyromegaly present.  Cardiovascular: Normal rate, regular rhythm, normal heart sounds and intact distal pulses.  Exam reveals no gallop and no friction rub.   No murmur heard. Pulmonary/Chest: Effort normal and breath sounds normal. No stridor. No respiratory distress. He has no wheezes. He has no rales. He exhibits no tenderness.  Abdominal: Soft. Bowel sounds are normal. He exhibits no distension and no mass. There is no tenderness. There is no rebound and no guarding.  Musculoskeletal: Normal range of motion. He exhibits no edema, tenderness or deformity.  Lymphadenopathy:    He has no cervical adenopathy.  Neurological: He is oriented to person, place, and time.  Skin: Skin is warm and dry. No rash noted. He is not diaphoretic. No erythema. No pallor.  Psychiatric: He has a normal mood and affect. His behavior is normal. Judgment and thought content normal.  Vitals reviewed.   Lab Results  Component Value Date   WBC 6.6 06/30/2015   HGB 14.5 06/30/2015   HCT 43 06/30/2015   PLT 221 06/30/2015   GLUCOSE 106 (H) 07/28/2015   CHOL 166 06/30/2015   TRIG 52 06/30/2015   HDL  54 06/30/2015   LDLCALC 54 06/30/2015   ALT 16 07/28/2015   AST 17 07/28/2015   NA 139 07/28/2015   K 4.9 07/28/2015   CL 104 07/28/2015   CREATININE 1.01 07/28/2015   BUN 17 07/28/2015   CO2 29 07/28/2015   TSH 0.61 07/01/2014   PSA 2.62 06/30/2015    No results found.  Assessment & Plan:   Jhovani was seen today for annual exam.  Diagnoses and all orders for this visit:  Routine health maintenance -     Zoster Vaccine Live, PF, (ZOSTAVAX) 09811 UNT/0.65ML injection; Inject  19,400 Units into the skin once.  Need for prophylactic vaccination against Streptococcus pneumoniae (pneumococcus) -     Pneumococcal polysaccharide vaccine 23-valent greater than or equal to 2yo subcutaneous/IM   I am having [name] start on Zoster Vaccine Live (PF). I am also having him maintain his aspirin and multivitamin.  Meds ordered this encounter  Medications  . Zoster Vaccine Live, PF, (ZOSTAVAX) 91478 UNT/0.65ML injection    Sig: Inject 19,400 Units into the skin once.    Dispense:  1 each    Refill:  0   See AVS for instructions about healthy living and anticipatory guidance.  Follow-up: Return if symptoms worsen or fail to improve.  Scarlette Calico, MD

## 2016-01-17 NOTE — Progress Notes (Signed)
Pre visit review using our clinic review tool, if applicable. No additional management support is needed unless otherwise documented below in the visit note. 

## 2016-02-01 ENCOUNTER — Ambulatory Visit (INDEPENDENT_AMBULATORY_CARE_PROVIDER_SITE_OTHER): Payer: PPO

## 2016-02-01 DIAGNOSIS — Z23 Encounter for immunization: Secondary | ICD-10-CM | POA: Diagnosis not present

## 2016-06-16 DIAGNOSIS — L821 Other seborrheic keratosis: Secondary | ICD-10-CM | POA: Diagnosis not present

## 2016-06-16 DIAGNOSIS — L812 Freckles: Secondary | ICD-10-CM | POA: Diagnosis not present

## 2016-06-16 DIAGNOSIS — L570 Actinic keratosis: Secondary | ICD-10-CM | POA: Diagnosis not present

## 2016-06-28 LAB — CBC AND DIFFERENTIAL
HEMATOCRIT: 45 % (ref 41–53)
HEMOGLOBIN: 14.9 g/dL (ref 13.5–17.5)
NEUTROS ABS: 5 /uL
Platelets: 232 10*3/uL (ref 150–399)
WBC: 7.4 10^3/mL

## 2016-06-28 LAB — BASIC METABOLIC PANEL
BUN: 11 mg/dL (ref 4–21)
CREATININE: 0.9 mg/dL (ref 0.6–1.3)
GLUCOSE: 93 mg/dL
Potassium: 4.5 mmol/L (ref 3.4–5.3)
SODIUM: 143 mmol/L (ref 137–147)

## 2016-07-19 ENCOUNTER — Encounter: Payer: Self-pay | Admitting: Internal Medicine

## 2016-07-19 NOTE — Progress Notes (Unsigned)
Results entered and sent to scan  

## 2017-01-24 ENCOUNTER — Encounter: Payer: Self-pay | Admitting: Internal Medicine

## 2017-01-24 ENCOUNTER — Encounter: Payer: PPO | Admitting: Internal Medicine

## 2017-01-24 ENCOUNTER — Ambulatory Visit (INDEPENDENT_AMBULATORY_CARE_PROVIDER_SITE_OTHER): Payer: PPO | Admitting: Internal Medicine

## 2017-01-24 ENCOUNTER — Other Ambulatory Visit (INDEPENDENT_AMBULATORY_CARE_PROVIDER_SITE_OTHER): Payer: PPO

## 2017-01-24 VITALS — BP 134/80 | HR 61 | Temp 98.2°F | Resp 16 | Ht 66.0 in | Wt 179.5 lb

## 2017-01-24 DIAGNOSIS — E785 Hyperlipidemia, unspecified: Secondary | ICD-10-CM

## 2017-01-24 DIAGNOSIS — Z0000 Encounter for general adult medical examination without abnormal findings: Secondary | ICD-10-CM | POA: Diagnosis not present

## 2017-01-24 DIAGNOSIS — Z23 Encounter for immunization: Secondary | ICD-10-CM | POA: Diagnosis not present

## 2017-01-24 LAB — COMPREHENSIVE METABOLIC PANEL
ALBUMIN: 4.1 g/dL (ref 3.5–5.2)
ALK PHOS: 57 U/L (ref 39–117)
ALT: 14 U/L (ref 0–53)
AST: 16 U/L (ref 0–37)
BUN: 14 mg/dL (ref 6–23)
CO2: 30 mEq/L (ref 19–32)
Calcium: 9.2 mg/dL (ref 8.4–10.5)
Chloride: 104 mEq/L (ref 96–112)
Creatinine, Ser: 0.88 mg/dL (ref 0.40–1.50)
GFR: 88.57 mL/min (ref >60.00)
Glucose, Bld: 102 mg/dL — ABNORMAL HIGH (ref 70–99)
POTASSIUM: 4.3 meq/L (ref 3.5–5.1)
Sodium: 140 mEq/L (ref 135–145)
TOTAL PROTEIN: 7 g/dL (ref 6.0–8.3)
Total Bilirubin: 1.4 mg/dL — ABNORMAL HIGH (ref 0.2–1.2)

## 2017-01-24 LAB — LIPID PANEL
CHOLESTEROL: 149 mg/dL (ref 0–200)
HDL: 44.4 mg/dL (ref >39.00)
LDL Cholesterol: 92 mg/dL (ref 0–99)
NONHDL: 104.3
Total CHOL/HDL Ratio: 3
Triglycerides: 61 mg/dL (ref 0.0–149.0)
VLDL: 12.2 mg/dL (ref 0.0–40.0)

## 2017-01-24 NOTE — Patient Instructions (Signed)

## 2017-01-24 NOTE — Progress Notes (Signed)
Subjective:  Patient ID: Christopher Dudley, male    DOB: 06-Mar-1935  Age: 82 y.o. MRN: [id number]  CC: Annual Exam and Hyperlipidemia   HPI Christopher Dudley presents for a CPX. He feels well and offers no complaints today.  Past Medical History:  Diagnosis Date  . Alcohol abuse    hx of  . Blood in stool    hx of  . 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 Colonic polyp 3-07   Past Surgical History:  Procedure Laterality Date  . INGUINAL HERNIA REPAIR  2001   bilateral, left done in 2006  . KNEE ARTHROSCOPY W/ MENISCAL REPAIR  '25    Right. [facility])  . pilondial cyst excision  82 yrs old  . ROTATOR CUFF REPAIR  07    reports that he has never smoked. He has never used smokeless tobacco. He reports that he does not drink alcohol or use drugs. family history includes Cancer in his father and mother; Coronary artery disease in his father; Gallbladder disease in his father; Heart attack in his father; Heart disease in his father; Hypertension in his father. No Known Allergies  Outpatient Medications Prior to Visit  Medication Sig Dispense Refill  . multivitamin (THERAGRAN) per tablet Take 1 tablet by mouth daily.      Marland Kitchen aspirin 81 MG EC tablet Take 81 mg by mouth daily.       No facility-administered medications prior to visit.     ROS Review of Systems  Constitutional: Negative.  Negative for diaphoresis, fatigue and unexpected weight change.  HENT: Negative.   Eyes: Negative.  Negative for visual disturbance.  Respiratory: Negative.  Negative for cough, chest tightness, shortness of breath and wheezing.   Cardiovascular: Negative for chest pain, palpitations and leg swelling.  Gastrointestinal: Negative for abdominal pain, constipation, diarrhea, nausea and vomiting.  Endocrine: Negative.   Genitourinary: Negative.  Negative for difficulty urinating and dysuria.  Musculoskeletal: Negative.  Negative for back pain, myalgias and neck pain.  Skin: Negative.     Allergic/Immunologic: Negative.   Neurological: Negative.  Negative for dizziness.  Hematological: Negative for adenopathy. Does not bruise/bleed easily.  Psychiatric/Behavioral: Negative.     Objective:  BP 134/80 (BP Location: Left Arm, Patient Position: Sitting, Cuff Size: Normal)   Pulse 61   Temp 98.2 F (36.8 C) (Oral)   Resp 16   Ht 5\' 6"  (1.676 m)   Wt 179 lb 8 oz (81.4 kg)   SpO2 98%   BMI 28.97 kg/m   BP Readings from Last 3 Encounters:  01/24/17 134/80  01/17/16 118/78  01/11/15 138/82    Wt Readings from Last 3 Encounters:  01/24/17 179 lb 8 oz (81.4 kg)  01/17/16 170 lb (77.1 kg)  01/11/15 191 lb (86.6 kg)    Physical Exam  Constitutional: He is oriented to person, place, and time. No distress.  HENT:  Mouth/Throat: Oropharynx is clear and moist. No oropharyngeal exudate.  Eyes: Conjunctivae are normal. Right eye exhibits no discharge. Left eye exhibits no discharge. No scleral icterus.  Neck: Normal range of motion. Neck supple. No JVD present. No thyromegaly present.  Cardiovascular: Normal rate, regular rhythm and intact distal pulses.  Exam reveals no gallop and no friction rub.   No murmur heard. Pulmonary/Chest: Effort normal and breath sounds normal. No respiratory distress. He has no wheezes. He has no rales. He exhibits no tenderness.  Abdominal: Soft. Bowel sounds are normal. He exhibits no distension and  no mass. There is no tenderness. There is no rebound and no guarding.  Musculoskeletal: Normal range of motion. He exhibits no edema, tenderness or deformity.  Lymphadenopathy:    He has no cervical adenopathy.  Neurological: He is alert and oriented to person, place, and time.  Skin: Skin is warm. No rash noted. He is not diaphoretic. No erythema. No pallor.  Vitals reviewed.   Lab Results  Component Value Date   WBC 7.4 06/28/2016   HGB 14.9 06/28/2016   HCT 45 06/28/2016   PLT 232 06/28/2016   GLUCOSE 102 (H) 01/24/2017   CHOL 149  01/24/2017   TRIG 61.0 01/24/2017   HDL 44.40 01/24/2017   LDLCALC 92 01/24/2017   ALT 14 01/24/2017   AST 16 01/24/2017   NA 140 01/24/2017   K 4.3 01/24/2017   CL 104 01/24/2017   CREATININE 0.88 01/24/2017   BUN 14 01/24/2017   CO2 30 01/24/2017   TSH 0.57 01/24/2017   PSA 2.62 06/30/2015    No results found.  Assessment & Plan:   Christopher Dudley was seen today for annual exam and hyperlipidemia.  Diagnoses and all orders for this visit:  Hyperlipidemia with target LDL less than 130- his ASCVD risk score is 32% so I have asked him to start taking a baby aspirin and statin for CV risk reduction -     Lipid panel; Future -     Comprehensive metabolic panel; Future -     Thyroid Panel With TSH; Future -     aspirin 81 MG EC tablet; Take 1 tablet (81 mg total) by mouth daily. -     atorvastatin (LIPITOR) 20 MG tablet; Take 1 tablet (20 mg total) by mouth daily.  Need for influenza vaccination -     Flu vaccine HIGH DOSE PF (Fluzone High dose)  Routine health maintenance   I have changed Christopher Dudley's aspirin. I am also having him start on atorvastatin. Additionally, I am having him maintain his multivitamin.  Meds ordered this encounter  Medications  . aspirin 81 MG EC tablet    Sig: Take 1 tablet (81 mg total) by mouth daily.    Dispense:  90 tablet    Refill:  3  . atorvastatin (LIPITOR) 20 MG tablet    Sig: Take 1 tablet (20 mg total) by mouth daily.    Dispense:  90 tablet    Refill:  3   See AVS for instructions about healthy living and anticipatory guidance.   Follow-up: Return if symptoms worsen or fail to improve.  Scarlette Calico, MD

## 2017-01-25 LAB — THYROID PANEL WITH TSH
Free Thyroxine Index: 2.5 (ref 1.4–3.8)
T3 UPTAKE: 31 % (ref 22–35)
T4 TOTAL: 8 ug/dL (ref 4.9–10.5)
TSH: 0.57 m[IU]/L (ref 0.40–4.50)

## 2017-01-26 MED ORDER — ATORVASTATIN CALCIUM 20 MG PO TABS: 20.0000 mg | ORAL_TABLET | Freq: Every day | ORAL | 3 refills | Status: DC

## 2017-01-26 MED ORDER — ASPIRIN 81 MG PO TBEC: 81.0000 mg | DELAYED_RELEASE_TABLET | Freq: Every day | ORAL | 3 refills | Status: DC

## 2017-01-26 NOTE — Assessment & Plan Note (Signed)

## 2017-02-17 ENCOUNTER — Ambulatory Visit (INDEPENDENT_AMBULATORY_CARE_PROVIDER_SITE_OTHER): Payer: PPO | Admitting: Family Medicine

## 2017-02-17 ENCOUNTER — Encounter: Payer: Self-pay | Admitting: Family Medicine

## 2017-02-17 VITALS — BP 138/82 | HR 85 | Temp 98.1°F | Wt 175.0 lb

## 2017-02-17 DIAGNOSIS — J209 Acute bronchitis, unspecified: Secondary | ICD-10-CM | POA: Diagnosis not present

## 2017-02-17 DIAGNOSIS — J069 Acute upper respiratory infection, unspecified: Secondary | ICD-10-CM | POA: Diagnosis not present

## 2017-02-17 MED ORDER — AZITHROMYCIN 250 MG PO TABS: ORAL_TABLET | ORAL | 0 refills | Status: DC

## 2017-02-17 NOTE — Progress Notes (Signed)
OFFICE VISIT  02/17/2017   CC:  Chief Complaint  Patient presents with  . Cough    x 1-2 weeks... Sx worsened yesterday...productive yellow/white sputum...chest congestion...ear fullness...pt has been taking OTC Sudafed, Tylenol, Nyquil w/ miniimal relief  . Sore Throat     HPI:    Patient is a 82 y.o. Caucasian male who presents for resp sx's. Onset 8-9 d/a, cough, nasal cong and PND.  +Productive cough, yellow initially, then whitish color. No fevers.  Never smoker.  No wheezing or SOB.  Lower rib cage sore after coughing so much. Cough persisting quite severely. Not improving any since onset of illness.  No signif ST, no HA.  Took sudafed and tylenol.    Past Medical History:  Diagnosis Date  . Alcohol abuse    hx of  . Blood in stool    hx of  . 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 Colonic polyp 3-07    Past Surgical History:  Procedure Laterality Date  . INGUINAL HERNIA REPAIR  2001   bilateral, left done in 2006  . KNEE ARTHROSCOPY W/ MENISCAL REPAIR  '69    Right. [facility])  . pilondial cyst excision  82 yrs old  . ROTATOR CUFF REPAIR  07    Outpatient Medications Prior to Visit  Medication Sig Dispense Refill  . aspirin 81 MG EC tablet Take 1 tablet (81 mg total) by mouth daily. 90 tablet 3  . multivitamin (THERAGRAN) per tablet Take 1 tablet by mouth daily.      Marland Kitchen atorvastatin (LIPITOR) 20 MG tablet Take 1 tablet (20 mg total) by mouth daily. 90 tablet 3   No facility-administered medications prior to visit.     No Known Allergies  ROS As per HPI  PE: Blood pressure 138/82, pulse 85, temperature 98.1 F (36.7 C), temperature source Oral, weight 175 lb (79.4 kg), SpO2 98 %. VS: noted--normal. Gen: alert, NAD, NONTOXIC APPEARING. HEENT: eyes without injection, drainage, or swelling.  Ears: EACs clear, TMs with normal light reflex and landmarks.  Nose: Clear rhinorrhea, with some dried, crusty exudate adherent to mildly injected mucosa.  No purulent  d/c.  No paranasal sinus TTP.  No facial swelling.  Throat and mouth without focal lesion.  No pharyngial swelling, erythema, or exudate.   Neck: supple, no LAD.   LUNGS: CTA bilat, nonlabored resps.   CV: RRR, no m/r/g. EXT: no c/c/e SKIN: no rash  LABS:    Chemistry      Component Value Date/Time   NA 140 01/24/2017 1543   NA 143 06/28/2016   K 4.3 01/24/2017 1543   K 4.5 07/04/2013   CL 104 01/24/2017 1543   CL 106 07/04/2013   CO2 30 01/24/2017 1543   CO2 28 07/04/2013   BUN 14 01/24/2017 1543   BUN 11 06/28/2016   CREATININE 0.88 01/24/2017 1543   CREATININE 0.9 07/04/2013   GLU 93 06/28/2016      Component Value Date/Time   CALCIUM 9.2 01/24/2017 1543   CALCIUM 8.7 07/04/2013   ALKPHOS 57 01/24/2017 1543   ALKPHOS 69 07/04/2013   AST 16 01/24/2017 1543   AST 20 07/04/2013   ALT 14 01/24/2017 1543   BILITOT 1.4 (H) 01/24/2017 1543   BILITOT 1.0 07/04/2013      IMPRESSION AND PLAN:  Prolonged URI with acute bronchitis--not improving any. Z-pack eRx'd. He has prednisone prescribed for his wife at home: take two 20mg  tabs once daily for the next  5d. Get otc generic robitussin DM OR Mucinex DM and use as directed on the packaging for cough and congestion. Use otc generic saline nasal spray 2-3 times per day to irrigate/moisturize your nasal passages.  An After Visit Summary was printed and given to the patient.  FOLLOW UP: Return if symptoms worsen or fail to improve.  Signed:  Crissie Sickles, MD           02/17/2017

## 2017-02-17 NOTE — Patient Instructions (Signed)
Take TWO of the 20 mg prednisone tabs once daily for 5 days.  Get otc generic robitussin DM as directed on the packaging for cough and congestion. Use otc generic saline nasal spray 2-3 times per day to irrigate/moisturize your nasal passages.

## 2017-05-23 ENCOUNTER — Ambulatory Visit: Payer: PPO | Admitting: Internal Medicine

## 2017-05-23 ENCOUNTER — Encounter: Payer: Self-pay | Admitting: Internal Medicine

## 2017-05-23 ENCOUNTER — Ambulatory Visit (INDEPENDENT_AMBULATORY_CARE_PROVIDER_SITE_OTHER)
Admission: RE | Admit: 2017-05-23 | Discharge: 2017-05-23 | Disposition: A | Discharge status: Home / Self Care | Payer: PPO | Source: Ambulatory Visit | Attending: Internal Medicine | Admitting: Internal Medicine

## 2017-05-23 VITALS — BP 138/82 | HR 98 | Temp 100.0°F | Ht 66.0 in | Wt 178.0 lb

## 2017-05-23 DIAGNOSIS — R059 Cough, unspecified: Secondary | ICD-10-CM

## 2017-05-23 DIAGNOSIS — R509 Fever, unspecified: Secondary | ICD-10-CM | POA: Diagnosis not present

## 2017-05-23 DIAGNOSIS — R6889 Other general symptoms and signs: Secondary | ICD-10-CM

## 2017-05-23 DIAGNOSIS — R05 Cough: Secondary | ICD-10-CM | POA: Diagnosis not present

## 2017-05-23 DIAGNOSIS — B9789 Other viral agents as the cause of diseases classified elsewhere: Secondary | ICD-10-CM

## 2017-05-23 DIAGNOSIS — J069 Acute upper respiratory infection, unspecified: Secondary | ICD-10-CM | POA: Diagnosis not present

## 2017-05-23 LAB — POC INFLUENZA A&B (BINAX/QUICKVUE)
INFLUENZA A, POC: NEGATIVE
INFLUENZA B, POC: NEGATIVE

## 2017-05-23 MED ORDER — HYDROCOD POLST-CPM POLST ER 10-8 MG/5ML PO SUER: 5.0000 mL | Freq: Two times a day (BID) | ORAL | 0 refills | Status: DC | PRN

## 2017-05-23 NOTE — Patient Instructions (Signed)
Cough, Adult  Coughing is a reflex that clears your throat and your airways. Coughing helps to heal and protect your lungs. It is normal to cough occasionally, but a cough that happens with other symptoms or lasts a long time may be a sign of a condition that needs treatment. A cough may last only 2-3 weeks (acute), or it may last longer than 8 weeks (chronic).  What are the causes?  Coughing is commonly caused by:   Breathing in substances that irritate your lungs.   A viral or bacterial respiratory infection.   Allergies.   Asthma.   Postnasal drip.   Smoking.   Acid backing up from the stomach into the esophagus (gastroesophageal reflux).   Certain medicines.   Chronic lung problems, including COPD (or rarely, lung cancer).   Other medical conditions such as heart failure.    Follow these instructions at home:  Pay attention to any changes in your symptoms. Take these actions to help with your discomfort:   Take medicines only as told by your health care provider.  ? If you were prescribed an antibiotic medicine, take it as told by your health care provider. Do not stop taking the antibiotic even if you start to feel better.  ? Talk with your health care provider before you take a cough suppressant medicine.   Drink enough fluid to keep your urine clear or pale yellow.   If the air is dry, use a cold steam vaporizer or humidifier in your bedroom or your home to help loosen secretions.   Avoid anything that causes you to cough at work or at home.   If your cough is worse at night, try sleeping in a semi-upright position.   Avoid cigarette smoke. If you smoke, quit smoking. If you need help quitting, ask your health care provider.   Avoid caffeine.   Avoid alcohol.   Rest as needed.    Contact a health care provider if:   You have new symptoms.   You cough up pus.   Your cough does not get better after 2-3 weeks, or your cough gets worse.   You cannot control your cough with suppressant  medicines and you are losing sleep.   You develop pain that is getting worse or pain that is not controlled with pain medicines.   You have a fever.   You have unexplained weight loss.   You have night sweats.  Get help right away if:   You cough up blood.   You have difficulty breathing.   Your heartbeat is very fast.  This information is not intended to replace advice given to you by your health care provider. Make sure you discuss any questions you have with your health care provider.  Document Released: 11/11/2010 Document Revised: 10/21/2015 Document Reviewed: 07/22/2014  Elsevier Interactive Patient Education  2018 [employer]

## 2017-05-23 NOTE — Progress Notes (Signed)
Subjective:  Patient ID: Christopher Dudley, male    DOB: 1934-09-04  Age: 82 y.o. MRN: [id number]  CC: Cough   HPI Christopher Dudley presents for a 5-day history of cough productive of white phlegm with fever up to 101.9 and chills.  He has also had a sore throat and muscle aches.  Outpatient Medications Prior to Visit  Medication Sig Dispense Refill  . aspirin 81 MG EC tablet Take 1 tablet (81 mg total) by mouth daily. 90 tablet 3  . multivitamin (THERAGRAN) per tablet Take 1 tablet by mouth daily.      Marland Kitchen azithromycin (ZITHROMAX) 250 MG tablet 2 tabs po qd x 1d, then 1 tab po qd x 4d 6 tablet 0   No facility-administered medications prior to visit.     ROS Review of Systems  Constitutional: Positive for chills and fever. Negative for fatigue.  HENT: Positive for sore throat. Negative for facial swelling, trouble swallowing and voice change.   Respiratory: Positive for cough. Negative for shortness of breath and wheezing.   Cardiovascular: Negative.  Negative for chest pain and leg swelling.  Gastrointestinal: Negative.  Negative for abdominal pain, diarrhea and vomiting.  Endocrine: Negative.   Genitourinary: Negative.  Negative for difficulty urinating.  Musculoskeletal: Positive for myalgias. Negative for back pain and neck pain.  Skin: Negative.  Negative for color change and rash.  Allergic/Immunologic: Negative.   Neurological: Negative.  Negative for dizziness, weakness and light-headedness.  Hematological: Negative for adenopathy. Does not bruise/bleed easily.  Psychiatric/Behavioral: Negative.     Objective:  BP 138/82 (BP Location: Right Arm, Patient Position: Sitting, Cuff Size: Normal)   Pulse 98   Temp 100 F (37.8 C)   Ht 5\' 6"  (1.676 m)   Wt 178 lb (80.7 kg)   SpO2 95%   BMI 28.73 kg/m   BP Readings from Last 3 Encounters:  05/23/17 138/82  02/17/17 138/82  01/24/17 134/80    Wt Readings from Last 3 Encounters:  05/23/17 178 lb (80.7 kg)  02/17/17  175 lb (79.4 kg)  01/24/17 179 lb 8 oz (81.4 kg)    Physical Exam  Constitutional: He is oriented to person, place, and time. No distress.  HENT:  Mouth/Throat: Oropharynx is clear and moist and mucous membranes are normal. Mucous membranes are not pale, not dry and not cyanotic. No oropharyngeal exudate, posterior oropharyngeal edema or posterior oropharyngeal erythema.  Eyes: Conjunctivae are normal. Left eye exhibits no discharge. No scleral icterus.  Neck: Normal range of motion. Neck supple. No JVD present. No thyromegaly present.  Cardiovascular: Normal rate, regular rhythm and normal heart sounds.  No murmur heard. Pulmonary/Chest: Effort normal. No accessory muscle usage. No tachypnea. No respiratory distress. He has no decreased breath sounds. He has no wheezes. He has rhonchi in the left lower field. He has no rales.  Wet rhonchi heard in the left lower zone that clears with cough  Abdominal: Soft. Bowel sounds are normal. He exhibits no distension and no mass. There is no tenderness.  Musculoskeletal: Normal range of motion. He exhibits no edema or tenderness.  Lymphadenopathy:    He has no cervical adenopathy.  Neurological: He is alert and oriented to person, place, and time.  Skin: Skin is warm and dry. No rash noted. He is not diaphoretic. No erythema. No pallor.  Vitals reviewed.   Lab Results  Component Value Date   WBC 7.4 06/28/2016   HGB 14.9 06/28/2016   HCT 45 06/28/2016  PLT 232 06/28/2016   GLUCOSE 102 (H) 01/24/2017   CHOL 149 01/24/2017   TRIG 61.0 01/24/2017   HDL 44.40 01/24/2017   LDLCALC 92 01/24/2017   ALT 14 01/24/2017   AST 16 01/24/2017   NA 140 01/24/2017   K 4.3 01/24/2017   CL 104 01/24/2017   CREATININE 0.88 01/24/2017   BUN 14 01/24/2017   CO2 30 01/24/2017   TSH 0.57 01/24/2017   PSA 2.62 06/30/2015    Dg Chest 2 View  Result Date: 05/23/2017 CLINICAL DATA:  Cough and fever EXAM: CHEST  2 VIEW COMPARISON:  June 16, 2004  FINDINGS: The lungs are clear. The heart size and pulmonary vascularity are normal. No adenopathy. There is aortic atherosclerosis. There is degenerative change in the thoracic spine. IMPRESSION: Aortic atherosclerosis.  No edema or consolidation. Aortic Atherosclerosis (ICD10-I70.0). Electronically Signed   By: Lowella Grip III M.D.   On: 05/23/2017 11:19    Assessment & Plan:   Christopher Dudley was seen today for cough.  Diagnoses and all orders for this visit:  Cough -     DG Chest 2 View; Future -     POC Influenza A&B (Binax test)  Fever, unspecified fever cause -     DG Chest 2 View; Future -     POC Influenza A&B (Binax test)  Viral URI with cough- His chest x-ray is negative for infiltrate, testing for influenza A and B is negative, symptoms and exam are consistent with a viral syndrome.  Will provide symptom relief with Tussionex as needed. -     chlorpheniramine-HYDROcodone (TUSSIONEX PENNKINETIC ER) 10-8 MG/5ML SUER; Take 5 mLs by mouth every 12 (twelve) hours as needed for cough.  Flu-like symptoms -     POC Influenza A&B (Binax test)   I have discontinued Christopher Dudley's azithromycin. I am also having him start on chlorpheniramine-HYDROcodone. Additionally, I am having him maintain his multivitamin and aspirin.  Meds ordered this encounter  Medications  . chlorpheniramine-HYDROcodone (TUSSIONEX PENNKINETIC ER) 10-8 MG/5ML SUER    Sig: Take 5 mLs by mouth every 12 (twelve) hours as needed for cough.    Dispense:  140 mL    Refill:  0     Follow-up: Return if symptoms worsen or fail to improve.  Scarlette Calico, MD

## 2017-06-22 DIAGNOSIS — L821 Other seborrheic keratosis: Secondary | ICD-10-CM | POA: Diagnosis not present

## 2017-06-22 DIAGNOSIS — L570 Actinic keratosis: Secondary | ICD-10-CM | POA: Diagnosis not present

## 2017-06-22 DIAGNOSIS — D1801 Hemangioma of skin and subcutaneous tissue: Secondary | ICD-10-CM | POA: Diagnosis not present

## 2017-06-22 DIAGNOSIS — D2372 Other benign neoplasm of skin of left lower limb, including hip: Secondary | ICD-10-CM | POA: Diagnosis not present

## 2017-07-04 LAB — TSH: TSH: 0.86 (ref 0.41–5.90)

## 2017-07-04 LAB — LIPID PANEL
Cholesterol: 172 (ref 0–200)
HDL: 50 (ref 35–70)
LDL CALC: 107
Triglycerides: 73 (ref 40–160)

## 2017-07-04 LAB — BASIC METABOLIC PANEL
BUN: 14 (ref 4–21)
Creatinine: 0.9 (ref 0.6–1.3)
Glucose: 92
Potassium: 4.1 (ref 3.4–5.3)
SODIUM: 139 (ref 137–147)

## 2017-07-04 LAB — CBC AND DIFFERENTIAL
HCT: 46 (ref 41–53)
Hemoglobin: 14.9 (ref 13.5–17.5)
PLATELETS: 239 (ref 150–399)
WBC: 7.1

## 2017-07-04 LAB — VITAMIN D 25 HYDROXY (VIT D DEFICIENCY, FRACTURES): Vit D, 25-Hydroxy: 25.7

## 2017-07-04 LAB — HEPATIC FUNCTION PANEL
ALT: 25 (ref 10–40)
AST: 18 (ref 14–40)
Alkaline Phosphatase: 72 (ref 25–125)
BILIRUBIN DIRECT: 0.3 (ref 0.01–0.4)
Bilirubin, Total: 3.5

## 2017-07-04 LAB — MICROALBUMIN, URINE: Microalb, Ur: 0.598

## 2017-07-04 LAB — PSA: PSA: 3.29

## 2017-07-23 DIAGNOSIS — H2513 Age-related nuclear cataract, bilateral: Secondary | ICD-10-CM | POA: Diagnosis not present

## 2017-07-23 DIAGNOSIS — H524 Presbyopia: Secondary | ICD-10-CM | POA: Diagnosis not present

## 2017-07-23 DIAGNOSIS — H11002 Unspecified pterygium of left eye: Secondary | ICD-10-CM | POA: Diagnosis not present

## 2018-01-30 ENCOUNTER — Encounter: Payer: Self-pay | Admitting: Internal Medicine

## 2018-01-30 ENCOUNTER — Ambulatory Visit (INDEPENDENT_AMBULATORY_CARE_PROVIDER_SITE_OTHER): Payer: PPO | Admitting: Internal Medicine

## 2018-01-30 VITALS — BP 140/80 | HR 69 | Temp 98.1°F | Resp 16 | Ht 66.0 in | Wt 174.2 lb

## 2018-01-30 DIAGNOSIS — Z0000 Encounter for general adult medical examination without abnormal findings: Secondary | ICD-10-CM

## 2018-01-30 DIAGNOSIS — Z23 Encounter for immunization: Secondary | ICD-10-CM

## 2018-01-30 NOTE — Patient Instructions (Signed)

## 2018-01-30 NOTE — Progress Notes (Signed)
Subjective:  Patient ID: Christopher Dudley, male    DOB: 1934-11-19  Age: 83 y.o. MRN: [id number]  CC: Annual Exam   HPI ADRON GEISEL presents for a CPX.  He feels well and offers no complaints.  He is very active, works out at [employer], and denies any recent episodes of DOE, CP, palpitations, edema, or fatigue.   Past Medical History:  Diagnosis Date  . Alcohol abuse    hx of  . Blood in stool    hx of  . 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 Colonic polyp 3-07   Past Surgical History:  Procedure Laterality Date  . INGUINAL HERNIA REPAIR  2001   bilateral, left done in 2006  . KNEE ARTHROSCOPY W/ MENISCAL REPAIR  '16    Right. [facility])  . pilondial cyst excision  83 yrs old  . ROTATOR CUFF REPAIR  07    reports that he has never smoked. He has never used smokeless tobacco. He reports that he does not drink alcohol or use drugs. family history includes Cancer in his father and mother; Coronary artery disease in his father; Gallbladder disease in his father; Heart attack in his father; Heart disease in his father; Hypertension in his father. No Known Allergies  Outpatient Medications Prior to Visit  Medication Sig Dispense Refill  . multivitamin (THERAGRAN) per tablet Take 1 tablet by mouth daily.      Marland Kitchen aspirin 81 MG EC tablet Take 1 tablet (81 mg total) by mouth daily. 90 tablet 3  . chlorpheniramine-HYDROcodone (TUSSIONEX PENNKINETIC ER) 10-8 MG/5ML SUER Take 5 mLs by mouth every 12 (twelve) hours as needed for cough. 140 mL 0   No facility-administered medications prior to visit.     ROS Review of Systems  Constitutional: Negative for diaphoresis, fatigue and unexpected weight change.  HENT: Negative.   Eyes: Negative for visual disturbance.  Respiratory: Negative.  Negative for cough, chest tightness, shortness of breath and wheezing.   Cardiovascular: Negative for chest pain, palpitations and leg swelling.  Gastrointestinal: Negative for abdominal pain,  constipation, diarrhea, nausea and vomiting.  Endocrine: Negative.   Genitourinary: Negative.  Negative for difficulty urinating.  Musculoskeletal: Negative.  Negative for arthralgias and myalgias.  Skin: Negative.  Negative for color change.  Neurological: Negative.  Negative for dizziness, weakness and light-headedness.  Hematological: Negative for adenopathy. Does not bruise/bleed easily.  Psychiatric/Behavioral: Negative.     Objective:  BP 140/80 (BP Location: Left Arm, Patient Position: Sitting, Cuff Size: Normal)   Pulse 69   Temp 98.1 F (36.7 C) (Oral)   Ht 5\' 6"  (1.676 m)   Wt 174 lb 4 oz (79 kg)   SpO2 96%   BMI 28.12 kg/m   BP Readings from Last 3 Encounters:  01/30/18 140/80  05/23/17 138/82  02/17/17 138/82    Wt Readings from Last 3 Encounters:  01/30/18 174 lb 4 oz (79 kg)  05/23/17 178 lb (80.7 kg)  02/17/17 175 lb (79.4 kg)    Physical Exam  Constitutional: He is oriented to person, place, and time. No distress.  HENT:  Mouth/Throat: Oropharynx is clear and moist. No oropharyngeal exudate.  Eyes: Conjunctivae are normal. No scleral icterus.  Neck: Normal range of motion. Neck supple. No JVD present. No thyromegaly present.  Cardiovascular: Normal rate, regular rhythm and normal heart sounds. Exam reveals no gallop and no friction rub.  No murmur heard. Pulmonary/Chest: Effort normal and breath sounds normal. No respiratory  distress. He has no wheezes. He has no rhonchi. He has no rales.  Abdominal: Soft. Bowel sounds are normal. He exhibits no mass. There is no tenderness.  Musculoskeletal: Normal range of motion. He exhibits no edema, tenderness or deformity.  Lymphadenopathy:    He has no cervical adenopathy.  Neurological: He is alert and oriented to person, place, and time.  Skin: Skin is warm and dry. No rash noted. He is not diaphoretic.  Psychiatric: He has a normal mood and affect. His behavior is normal. Judgment and thought content normal.    Vitals reviewed.   Lab Results  Component Value Date   WBC 7.1 07/04/2017   HGB 14.9 07/04/2017   HCT 46 07/04/2017   PLT 239 07/04/2017   GLUCOSE 102 (H) 01/24/2017   CHOL 172 07/04/2017   TRIG 73 07/04/2017   HDL 50 07/04/2017   LDLCALC 107 07/04/2017   ALT 25 07/04/2017   AST 18 07/04/2017   NA 139 07/04/2017   K 4.1 07/04/2017   CL 104 01/24/2017   CREATININE 0.9 07/04/2017   BUN 14 07/04/2017   CO2 30 01/24/2017   TSH 0.86 07/04/2017   PSA 3.29 07/04/2017   MICROALBUR 0.598 07/04/2017    Dg Chest 2 View  Result Date: 05/23/2017 CLINICAL DATA:  Cough and fever EXAM: CHEST  2 VIEW COMPARISON:  June 16, 2004 FINDINGS: The lungs are clear. The heart size and pulmonary vascularity are normal. No adenopathy. There is aortic atherosclerosis. There is degenerative change in the thoracic spine. IMPRESSION: Aortic atherosclerosis.  No edema or consolidation. Aortic Atherosclerosis (ICD10-I70.0). Electronically Signed   By: Lowella Grip III M.D.   On: 05/23/2017 11:19    Assessment & Plan:   Darald was seen today for annual exam.  Diagnoses and all orders for this visit:  Routine health maintenance  Need for influenza vaccination -     Flu vaccine HIGH DOSE PF (Fluzone High dose)   I have discontinued Emeline Gins. Norlander's aspirin and chlorpheniramine-HYDROcodone. I am also having him maintain his multivitamin.  No orders of the defined types were placed in this encounter.  See AVS for instructions about healthy living and anticipatory guidance.  Follow-up: Return if symptoms worsen or fail to improve.  Scarlette Calico, MD

## 2018-01-30 NOTE — Assessment & Plan Note (Signed)

## 2018-04-11 DIAGNOSIS — A630 Anogenital (venereal) warts: Secondary | ICD-10-CM | POA: Diagnosis not present

## 2018-04-11 DIAGNOSIS — B078 Other viral warts: Secondary | ICD-10-CM | POA: Diagnosis not present

## 2018-04-22 ENCOUNTER — Ambulatory Visit: Payer: PPO | Admitting: Internal Medicine

## 2018-04-22 ENCOUNTER — Encounter

## 2018-05-01 ENCOUNTER — Encounter: Payer: Self-pay | Admitting: Internal Medicine

## 2018-06-24 DIAGNOSIS — L570 Actinic keratosis: Secondary | ICD-10-CM | POA: Diagnosis not present

## 2018-06-24 DIAGNOSIS — L812 Freckles: Secondary | ICD-10-CM | POA: Diagnosis not present

## 2018-06-24 DIAGNOSIS — L821 Other seborrheic keratosis: Secondary | ICD-10-CM | POA: Diagnosis not present

## 2018-07-17 LAB — HEPATIC FUNCTION PANEL
ALT: 27 (ref 10–40)
AST: 17 (ref 14–40)
Alkaline Phosphatase: 78 (ref 25–125)
Bilirubin, Direct: 0.3 (ref 0.01–0.4)
Bilirubin, Total: 1.6

## 2018-07-17 LAB — BASIC METABOLIC PANEL
BUN: 13 (ref 4–21)
Creatinine: 1 (ref 0.6–1.3)
Glucose: 102
Potassium: 4.4 (ref 3.4–5.3)
Sodium: 138 (ref 137–147)

## 2018-07-17 LAB — LIPID PANEL: LDL Cholesterol: 115

## 2018-07-24 DIAGNOSIS — H11002 Unspecified pterygium of left eye: Secondary | ICD-10-CM | POA: Diagnosis not present

## 2018-07-24 DIAGNOSIS — H2513 Age-related nuclear cataract, bilateral: Secondary | ICD-10-CM | POA: Diagnosis not present

## 2018-07-24 DIAGNOSIS — H5203 Hypermetropia, bilateral: Secondary | ICD-10-CM | POA: Diagnosis not present

## 2018-07-24 DIAGNOSIS — H524 Presbyopia: Secondary | ICD-10-CM | POA: Diagnosis not present

## 2018-08-17 ENCOUNTER — Telehealth: Payer: Self-pay | Admitting: Pulmonary Disease

## 2018-09-03 NOTE — Telephone Encounter (Signed)
None

## 2018-09-18 LAB — PSA: PSA: 5.56

## 2018-10-30 DIAGNOSIS — M7522 Bicipital tendinitis, left shoulder: Secondary | ICD-10-CM | POA: Diagnosis not present

## 2018-12-04 DIAGNOSIS — M25531 Pain in right wrist: Secondary | ICD-10-CM | POA: Diagnosis not present

## 2018-12-09 DIAGNOSIS — M6289 Other specified disorders of muscle: Secondary | ICD-10-CM | POA: Diagnosis not present

## 2018-12-09 DIAGNOSIS — S56911D Strain of unspecified muscles, fascia and tendons at forearm level, right arm, subsequent encounter: Secondary | ICD-10-CM | POA: Diagnosis not present

## 2018-12-09 DIAGNOSIS — M62838 Other muscle spasm: Secondary | ICD-10-CM | POA: Diagnosis not present

## 2018-12-13 DIAGNOSIS — M62838 Other muscle spasm: Secondary | ICD-10-CM | POA: Diagnosis not present

## 2018-12-13 DIAGNOSIS — S56911D Strain of unspecified muscles, fascia and tendons at forearm level, right arm, subsequent encounter: Secondary | ICD-10-CM | POA: Diagnosis not present

## 2018-12-13 DIAGNOSIS — M6289 Other specified disorders of muscle: Secondary | ICD-10-CM | POA: Diagnosis not present

## 2018-12-17 DIAGNOSIS — M6289 Other specified disorders of muscle: Secondary | ICD-10-CM | POA: Diagnosis not present

## 2018-12-17 DIAGNOSIS — S56911D Strain of unspecified muscles, fascia and tendons at forearm level, right arm, subsequent encounter: Secondary | ICD-10-CM | POA: Diagnosis not present

## 2018-12-17 DIAGNOSIS — M62838 Other muscle spasm: Secondary | ICD-10-CM | POA: Diagnosis not present

## 2018-12-19 DIAGNOSIS — M62838 Other muscle spasm: Secondary | ICD-10-CM | POA: Diagnosis not present

## 2018-12-19 DIAGNOSIS — M6289 Other specified disorders of muscle: Secondary | ICD-10-CM | POA: Diagnosis not present

## 2018-12-19 DIAGNOSIS — S56911D Strain of unspecified muscles, fascia and tendons at forearm level, right arm, subsequent encounter: Secondary | ICD-10-CM | POA: Diagnosis not present

## 2018-12-24 DIAGNOSIS — S56911D Strain of unspecified muscles, fascia and tendons at forearm level, right arm, subsequent encounter: Secondary | ICD-10-CM | POA: Diagnosis not present

## 2018-12-24 DIAGNOSIS — M62838 Other muscle spasm: Secondary | ICD-10-CM | POA: Diagnosis not present

## 2018-12-24 DIAGNOSIS — M6289 Other specified disorders of muscle: Secondary | ICD-10-CM | POA: Diagnosis not present

## 2018-12-26 DIAGNOSIS — S56911D Strain of unspecified muscles, fascia and tendons at forearm level, right arm, subsequent encounter: Secondary | ICD-10-CM | POA: Diagnosis not present

## 2018-12-26 DIAGNOSIS — M6289 Other specified disorders of muscle: Secondary | ICD-10-CM | POA: Diagnosis not present

## 2018-12-26 DIAGNOSIS — M62838 Other muscle spasm: Secondary | ICD-10-CM | POA: Diagnosis not present

## 2019-01-07 DIAGNOSIS — M79631 Pain in right forearm: Secondary | ICD-10-CM | POA: Diagnosis not present

## 2019-01-08 DIAGNOSIS — M25531 Pain in right wrist: Secondary | ICD-10-CM | POA: Diagnosis not present

## 2019-01-22 DIAGNOSIS — M25521 Pain in right elbow: Secondary | ICD-10-CM | POA: Diagnosis not present

## 2019-01-24 LAB — PSA: PSA: 4.27

## 2019-02-05 ENCOUNTER — Ambulatory Visit (INDEPENDENT_AMBULATORY_CARE_PROVIDER_SITE_OTHER): Payer: PPO | Admitting: Internal Medicine

## 2019-02-05 ENCOUNTER — Encounter: Payer: Self-pay | Admitting: Internal Medicine

## 2019-02-05 ENCOUNTER — Other Ambulatory Visit: Payer: Self-pay

## 2019-02-05 VITALS — BP 162/84 | HR 89 | Temp 98.1°F | Resp 16 | Ht 66.0 in | Wt 195.0 lb

## 2019-02-05 DIAGNOSIS — Z23 Encounter for immunization: Secondary | ICD-10-CM

## 2019-02-05 DIAGNOSIS — I10 Essential (primary) hypertension: Secondary | ICD-10-CM | POA: Diagnosis not present

## 2019-02-05 DIAGNOSIS — E785 Hyperlipidemia, unspecified: Secondary | ICD-10-CM

## 2019-02-05 NOTE — Progress Notes (Addendum)
Subjective:  Patient ID: Christopher Dudley, male    DOB: 1934/08/26  Age: 84 y.o. MRN: [id number]  CC: Hypertension   HPI Christopher Dudley presents for f/up- He complains of weight gain but otherwise feels well and offers no other complaints.  He is active and denies any recent episodes of CP, DOE, palpitations, edema, or fatigue.  He tells me he had lab work done at the [place] about 6 months ago.  The only result he brings in today is a PSA.  He has other results at home that he will bring in for me to take a look at.  Outpatient Medications Prior to Visit  Medication Sig Dispense Refill  . multivitamin (THERAGRAN) per tablet Take 1 tablet by mouth daily.       No facility-administered medications prior to visit.     ROS Review of Systems  Constitutional: Positive for unexpected weight change. Negative for appetite change, diaphoresis and fatigue.  HENT: Negative.   Eyes: Negative for visual disturbance.  Respiratory: Negative for chest tightness, shortness of breath and wheezing.   Cardiovascular: Negative for chest pain, palpitations and leg swelling.  Gastrointestinal: Negative for abdominal pain, constipation, diarrhea, nausea and vomiting.  Endocrine: Negative.  Negative for cold intolerance and heat intolerance.  Genitourinary: Negative.  Negative for difficulty urinating.  Musculoskeletal: Negative.  Negative for arthralgias and myalgias.  Skin: Negative.  Negative for color change.  Neurological: Negative.  Negative for dizziness, weakness, light-headedness and headaches.  Hematological: Negative for adenopathy. Does not bruise/bleed easily.  Psychiatric/Behavioral: Negative.     Objective:  BP (!) 162/84 (BP Location: Left Arm, Patient Position: Sitting, Cuff Size: Normal)   Pulse 89   Temp 98.1 F (36.7 C) (Oral)   Resp 16   Ht 5\' 6"  (1.676 m)   Wt 195 lb (88.5 kg)   SpO2 97%   BMI 31.47 kg/m   BP Readings from Last 3 Encounters:  02/05/19 (!) 162/84  01/30/18  140/80  05/23/17 138/82    Wt Readings from Last 3 Encounters:  02/05/19 195 lb (88.5 kg)  01/30/18 174 lb 4 oz (79 kg)  05/23/17 178 lb (80.7 kg)    Physical Exam Vitals signs reviewed.  Constitutional:      Appearance: He is obese. He is not ill-appearing or diaphoretic.  HENT:     Nose: Nose normal.     Mouth/Throat:     Mouth: Mucous membranes are moist.  Eyes:     Conjunctiva/sclera: Conjunctivae normal.  Neck:     Musculoskeletal: Normal range of motion and neck supple.  Cardiovascular:     Rate and Rhythm: Normal rate and regular rhythm.     Heart sounds: Normal heart sounds, S1 normal and S2 normal. No murmur. No systolic murmur. No diastolic murmur.     Comments: EKG ----  Sinus  Rhythm  WITHIN NORMAL LIMITS Pulmonary:     Effort: Pulmonary effort is normal.     Breath sounds: No stridor. No wheezing, rhonchi or rales.  Abdominal:     General: Abdomen is protuberant. Bowel sounds are normal. There is no distension.     Palpations: There is no hepatomegaly or splenomegaly.     Tenderness: There is no abdominal tenderness.     Hernia: No hernia is present.  Musculoskeletal:     Right lower leg: No edema.     Left lower leg: No edema.  Lymphadenopathy:     Cervical: No cervical adenopathy.  Skin:  General: Skin is warm and dry.  Neurological:     General: No focal deficit present.     Mental Status: He is alert.  Psychiatric:        Mood and Affect: Mood normal.     Lab Results  Component Value Date   WBC 7.1 07/04/2017   HGB 14.9 07/04/2017   HCT 46 07/04/2017   PLT 239 07/04/2017   GLUCOSE 102 (H) 01/24/2017   CHOL 172 07/04/2017   TRIG 73 07/04/2017   HDL 50 07/04/2017   LDLCALC 115 07/17/2018   ALT 27 07/17/2018   AST 17 07/17/2018   NA 138 07/17/2018   K 4.4 07/17/2018   CL 104 01/24/2017   CREATININE 1.0 07/17/2018   BUN 13 07/17/2018   CO2 30 01/24/2017   TSH 0.86 07/04/2017   PSA 4.27 01/24/2019   MICROALBUR 0.598 07/04/2017     Dg Chest 2 View  Result Date: 05/23/2017 CLINICAL DATA:  Cough and fever EXAM: CHEST  2 VIEW COMPARISON:  June 16, 2004 FINDINGS: The lungs are clear. The heart size and pulmonary vascularity are normal. No adenopathy. There is aortic atherosclerosis. There is degenerative change in the thoracic spine. IMPRESSION: Aortic atherosclerosis.  No edema or consolidation. Aortic Atherosclerosis (ICD10-I70.0). Electronically Signed   By: Lowella Grip III M.D.   On: 05/23/2017 11:19    Assessment & Plan:   Christopher Dudley was seen today for hypertension.  Diagnoses and all orders for this visit:  Hyperlipidemia with target LDL less than 130- No lab work was done today at his request.  He agrees to bring me a copy of the labs that were done 6 months ago at the [place].  Need for influenza vaccination -     Flu Vaccine QUAD High Dose(Fluad)  Hypertension, unspecified type-  He has developed hypertension.  I will review his labs from the [place] when they were arrive.  He does not want to start taking an antihypertensive.  He agrees to improve his lifestyle modifications. -     EKG 12-Lead   I am having Christopher Dudley maintain his multivitamin.  No orders of the defined types were placed in this encounter.    Follow-up: Return in about 6 months (around 08/05/2019).  Scarlette Calico, MD

## 2019-02-05 NOTE — Patient Instructions (Signed)

## 2019-02-19 DIAGNOSIS — M25521 Pain in right elbow: Secondary | ICD-10-CM | POA: Diagnosis not present

## 2019-03-04 DIAGNOSIS — M25521 Pain in right elbow: Secondary | ICD-10-CM | POA: Diagnosis not present

## 2019-03-19 DIAGNOSIS — M25521 Pain in right elbow: Secondary | ICD-10-CM | POA: Diagnosis not present

## 2019-03-26 ENCOUNTER — Ambulatory Visit: Payer: PPO

## 2019-04-08 DIAGNOSIS — Y999 Unspecified external cause status: Secondary | ICD-10-CM | POA: Diagnosis not present

## 2019-04-08 DIAGNOSIS — X58XXXA Exposure to other specified factors, initial encounter: Secondary | ICD-10-CM | POA: Diagnosis not present

## 2019-04-08 DIAGNOSIS — S53431A Radial collateral ligament sprain of right elbow, initial encounter: Secondary | ICD-10-CM | POA: Diagnosis not present

## 2019-04-08 DIAGNOSIS — S53441A Ulnar collateral ligament sprain of right elbow, initial encounter: Secondary | ICD-10-CM | POA: Diagnosis not present

## 2019-04-21 DIAGNOSIS — S53441A Ulnar collateral ligament sprain of right elbow, initial encounter: Secondary | ICD-10-CM | POA: Diagnosis not present

## 2019-04-22 DIAGNOSIS — S5321XD Traumatic rupture of right radial collateral ligament, subsequent encounter: Secondary | ICD-10-CM | POA: Diagnosis not present

## 2019-04-22 DIAGNOSIS — M25621 Stiffness of right elbow, not elsewhere classified: Secondary | ICD-10-CM | POA: Diagnosis not present

## 2019-04-28 DIAGNOSIS — M25621 Stiffness of right elbow, not elsewhere classified: Secondary | ICD-10-CM | POA: Diagnosis not present

## 2019-04-28 DIAGNOSIS — S5321XD Traumatic rupture of right radial collateral ligament, subsequent encounter: Secondary | ICD-10-CM | POA: Diagnosis not present

## 2019-05-06 DIAGNOSIS — S5321XD Traumatic rupture of right radial collateral ligament, subsequent encounter: Secondary | ICD-10-CM | POA: Diagnosis not present

## 2019-05-06 DIAGNOSIS — M25621 Stiffness of right elbow, not elsewhere classified: Secondary | ICD-10-CM | POA: Diagnosis not present

## 2019-05-14 DIAGNOSIS — M25621 Stiffness of right elbow, not elsewhere classified: Secondary | ICD-10-CM | POA: Diagnosis not present

## 2019-05-14 DIAGNOSIS — S5321XD Traumatic rupture of right radial collateral ligament, subsequent encounter: Secondary | ICD-10-CM | POA: Diagnosis not present

## 2019-05-19 DIAGNOSIS — S5321XD Traumatic rupture of right radial collateral ligament, subsequent encounter: Secondary | ICD-10-CM | POA: Diagnosis not present

## 2019-05-19 DIAGNOSIS — M25621 Stiffness of right elbow, not elsewhere classified: Secondary | ICD-10-CM | POA: Diagnosis not present

## 2019-05-21 DIAGNOSIS — S5321XD Traumatic rupture of right radial collateral ligament, subsequent encounter: Secondary | ICD-10-CM | POA: Diagnosis not present

## 2019-05-21 DIAGNOSIS — M25621 Stiffness of right elbow, not elsewhere classified: Secondary | ICD-10-CM | POA: Diagnosis not present

## 2019-05-21 DIAGNOSIS — Z9889 Other specified postprocedural states: Secondary | ICD-10-CM | POA: Diagnosis not present

## 2019-05-26 DIAGNOSIS — M25621 Stiffness of right elbow, not elsewhere classified: Secondary | ICD-10-CM | POA: Diagnosis not present

## 2019-05-26 DIAGNOSIS — S5321XD Traumatic rupture of right radial collateral ligament, subsequent encounter: Secondary | ICD-10-CM | POA: Diagnosis not present

## 2019-05-28 DIAGNOSIS — S5321XD Traumatic rupture of right radial collateral ligament, subsequent encounter: Secondary | ICD-10-CM | POA: Diagnosis not present

## 2019-05-28 DIAGNOSIS — M25621 Stiffness of right elbow, not elsewhere classified: Secondary | ICD-10-CM | POA: Diagnosis not present

## 2019-06-04 DIAGNOSIS — M25621 Stiffness of right elbow, not elsewhere classified: Secondary | ICD-10-CM | POA: Diagnosis not present

## 2019-06-04 DIAGNOSIS — S5321XD Traumatic rupture of right radial collateral ligament, subsequent encounter: Secondary | ICD-10-CM | POA: Diagnosis not present

## 2019-06-09 DIAGNOSIS — M25621 Stiffness of right elbow, not elsewhere classified: Secondary | ICD-10-CM | POA: Diagnosis not present

## 2019-06-09 DIAGNOSIS — S5321XD Traumatic rupture of right radial collateral ligament, subsequent encounter: Secondary | ICD-10-CM | POA: Diagnosis not present

## 2019-06-11 DIAGNOSIS — S5321XD Traumatic rupture of right radial collateral ligament, subsequent encounter: Secondary | ICD-10-CM | POA: Diagnosis not present

## 2019-06-11 DIAGNOSIS — M25621 Stiffness of right elbow, not elsewhere classified: Secondary | ICD-10-CM | POA: Diagnosis not present

## 2019-06-16 DIAGNOSIS — S5321XD Traumatic rupture of right radial collateral ligament, subsequent encounter: Secondary | ICD-10-CM | POA: Diagnosis not present

## 2019-06-16 DIAGNOSIS — M25621 Stiffness of right elbow, not elsewhere classified: Secondary | ICD-10-CM | POA: Diagnosis not present

## 2019-06-18 DIAGNOSIS — S5321XD Traumatic rupture of right radial collateral ligament, subsequent encounter: Secondary | ICD-10-CM | POA: Diagnosis not present

## 2019-06-18 DIAGNOSIS — M25621 Stiffness of right elbow, not elsewhere classified: Secondary | ICD-10-CM | POA: Diagnosis not present

## 2019-06-24 DIAGNOSIS — M25621 Stiffness of right elbow, not elsewhere classified: Secondary | ICD-10-CM | POA: Diagnosis not present

## 2019-06-24 DIAGNOSIS — S5321XD Traumatic rupture of right radial collateral ligament, subsequent encounter: Secondary | ICD-10-CM | POA: Diagnosis not present

## 2019-06-25 DIAGNOSIS — L570 Actinic keratosis: Secondary | ICD-10-CM | POA: Diagnosis not present

## 2019-06-25 DIAGNOSIS — L821 Other seborrheic keratosis: Secondary | ICD-10-CM | POA: Diagnosis not present

## 2019-06-25 DIAGNOSIS — D1801 Hemangioma of skin and subcutaneous tissue: Secondary | ICD-10-CM | POA: Diagnosis not present

## 2019-06-25 DIAGNOSIS — Z85828 Personal history of other malignant neoplasm of skin: Secondary | ICD-10-CM | POA: Diagnosis not present

## 2019-06-27 DIAGNOSIS — S5321XD Traumatic rupture of right radial collateral ligament, subsequent encounter: Secondary | ICD-10-CM | POA: Diagnosis not present

## 2019-06-27 DIAGNOSIS — M25621 Stiffness of right elbow, not elsewhere classified: Secondary | ICD-10-CM | POA: Diagnosis not present

## 2019-06-30 DIAGNOSIS — M25621 Stiffness of right elbow, not elsewhere classified: Secondary | ICD-10-CM | POA: Diagnosis not present

## 2019-06-30 DIAGNOSIS — S5321XD Traumatic rupture of right radial collateral ligament, subsequent encounter: Secondary | ICD-10-CM | POA: Diagnosis not present

## 2019-06-30 DIAGNOSIS — Z9889 Other specified postprocedural states: Secondary | ICD-10-CM | POA: Diagnosis not present

## 2019-07-02 DIAGNOSIS — S5321XD Traumatic rupture of right radial collateral ligament, subsequent encounter: Secondary | ICD-10-CM | POA: Diagnosis not present

## 2019-07-02 DIAGNOSIS — M25621 Stiffness of right elbow, not elsewhere classified: Secondary | ICD-10-CM | POA: Diagnosis not present

## 2019-07-14 ENCOUNTER — Other Ambulatory Visit: Payer: Self-pay

## 2019-07-14 ENCOUNTER — Ambulatory Visit: Payer: PPO | Attending: Internal Medicine

## 2019-07-14 DIAGNOSIS — Z23 Encounter for immunization: Secondary | ICD-10-CM

## 2019-07-14 NOTE — Progress Notes (Signed)
   Covid-19 Vaccination Clinic  Name:  Christopher Dudley    MRN: [id number] DOB: 1934-06-03  07/14/2019  [name] was observed post Covid-19 immunization for 15 minutes without incidence. He was provided with Vaccine Information Sheet and instruction to access the V-Safe system.   Mr. Eye was instructed to call 911 with any severe reactions post vaccine: Marland Kitchen Difficulty breathing  . Swelling of your face and throat  . A fast heartbeat  . A bad rash all over your body  . Dizziness and weakness    Immunizations Administered    Name Date Dose VIS Date Route   Moderna COVID-19 Vaccine 07/14/2019 10:49 AM 0.5 mL 04/29/2019 Intramuscular   Manufacturer: Moderna   Lot: [id number]   [id number]

## 2019-07-30 DIAGNOSIS — H5203 Hypermetropia, bilateral: Secondary | ICD-10-CM | POA: Diagnosis not present

## 2019-07-30 DIAGNOSIS — H52203 Unspecified astigmatism, bilateral: Secondary | ICD-10-CM | POA: Diagnosis not present

## 2019-07-30 DIAGNOSIS — H25813 Combined forms of age-related cataract, bilateral: Secondary | ICD-10-CM | POA: Diagnosis not present

## 2019-07-30 DIAGNOSIS — H524 Presbyopia: Secondary | ICD-10-CM | POA: Diagnosis not present

## 2019-08-10 IMAGING — DX DG CHEST 2V
2 series · 2 of 2 positions shown · non-contrast
Comparison: June 16, 2004

CLINICAL DATA: Cough and fever

EXAM:
CHEST  2 VIEW

[chest pa]
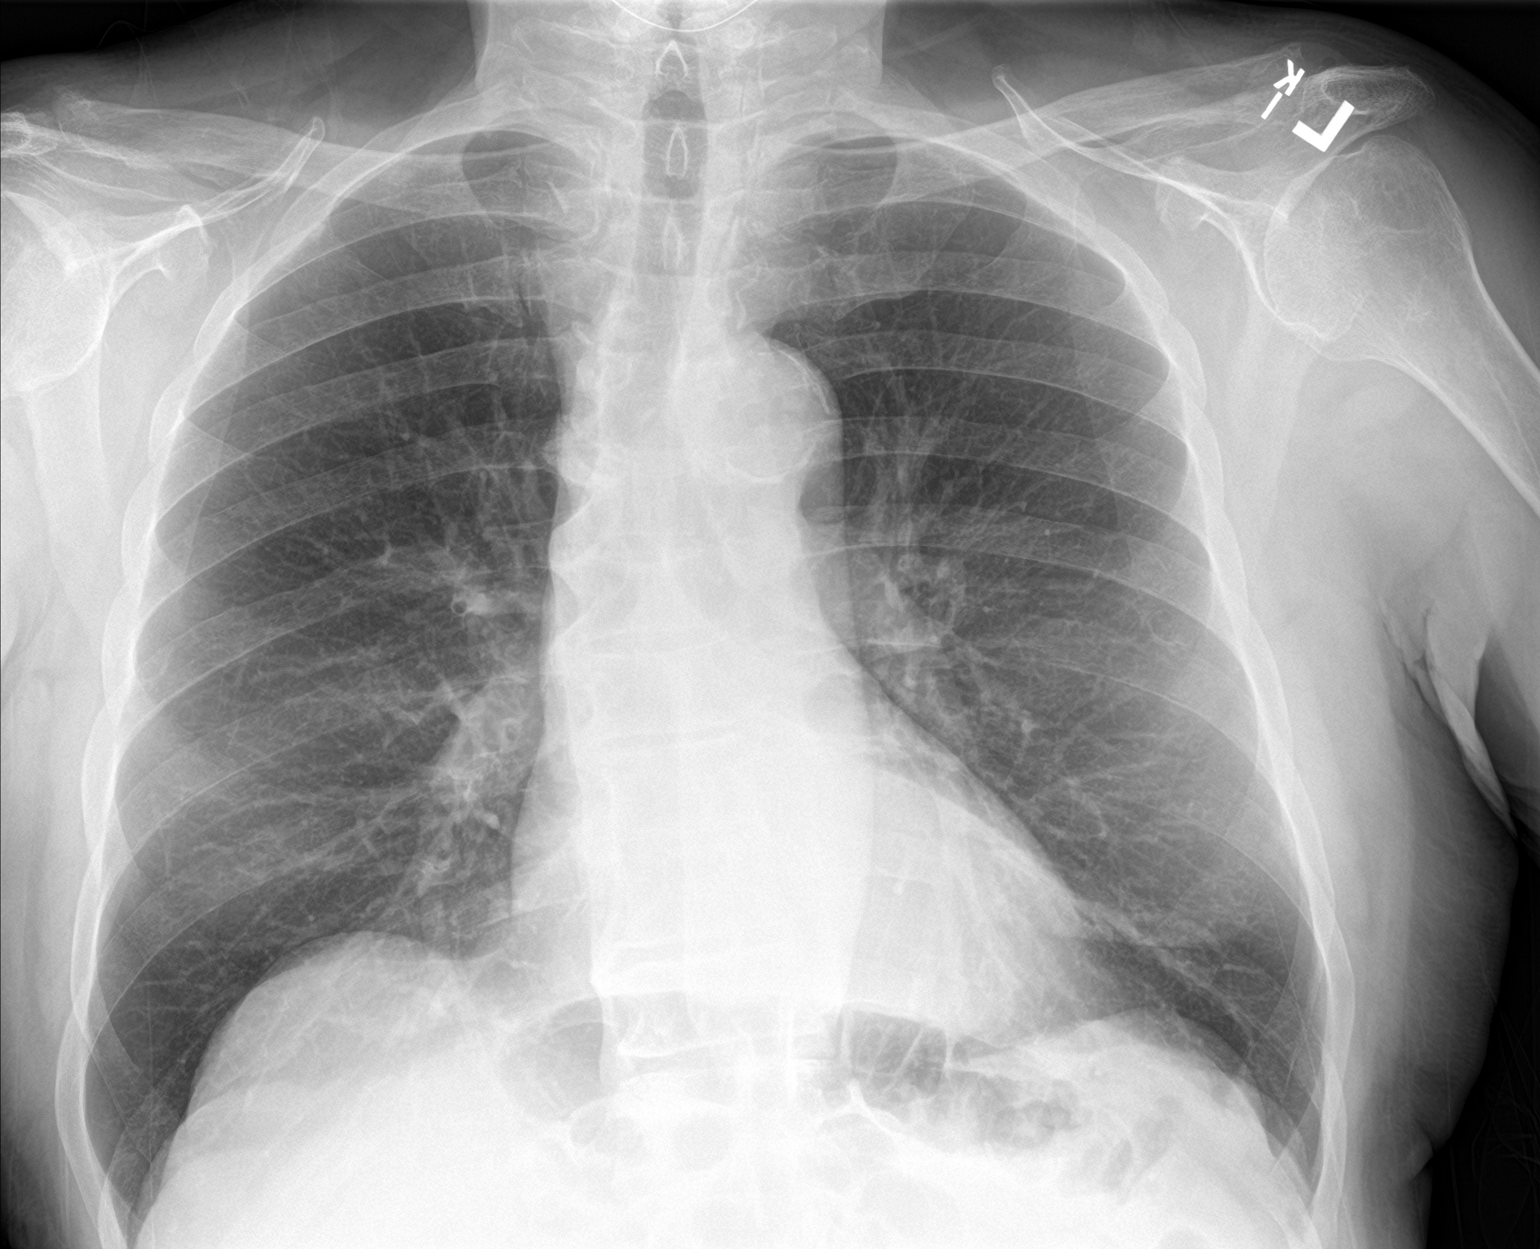

[chest lat]
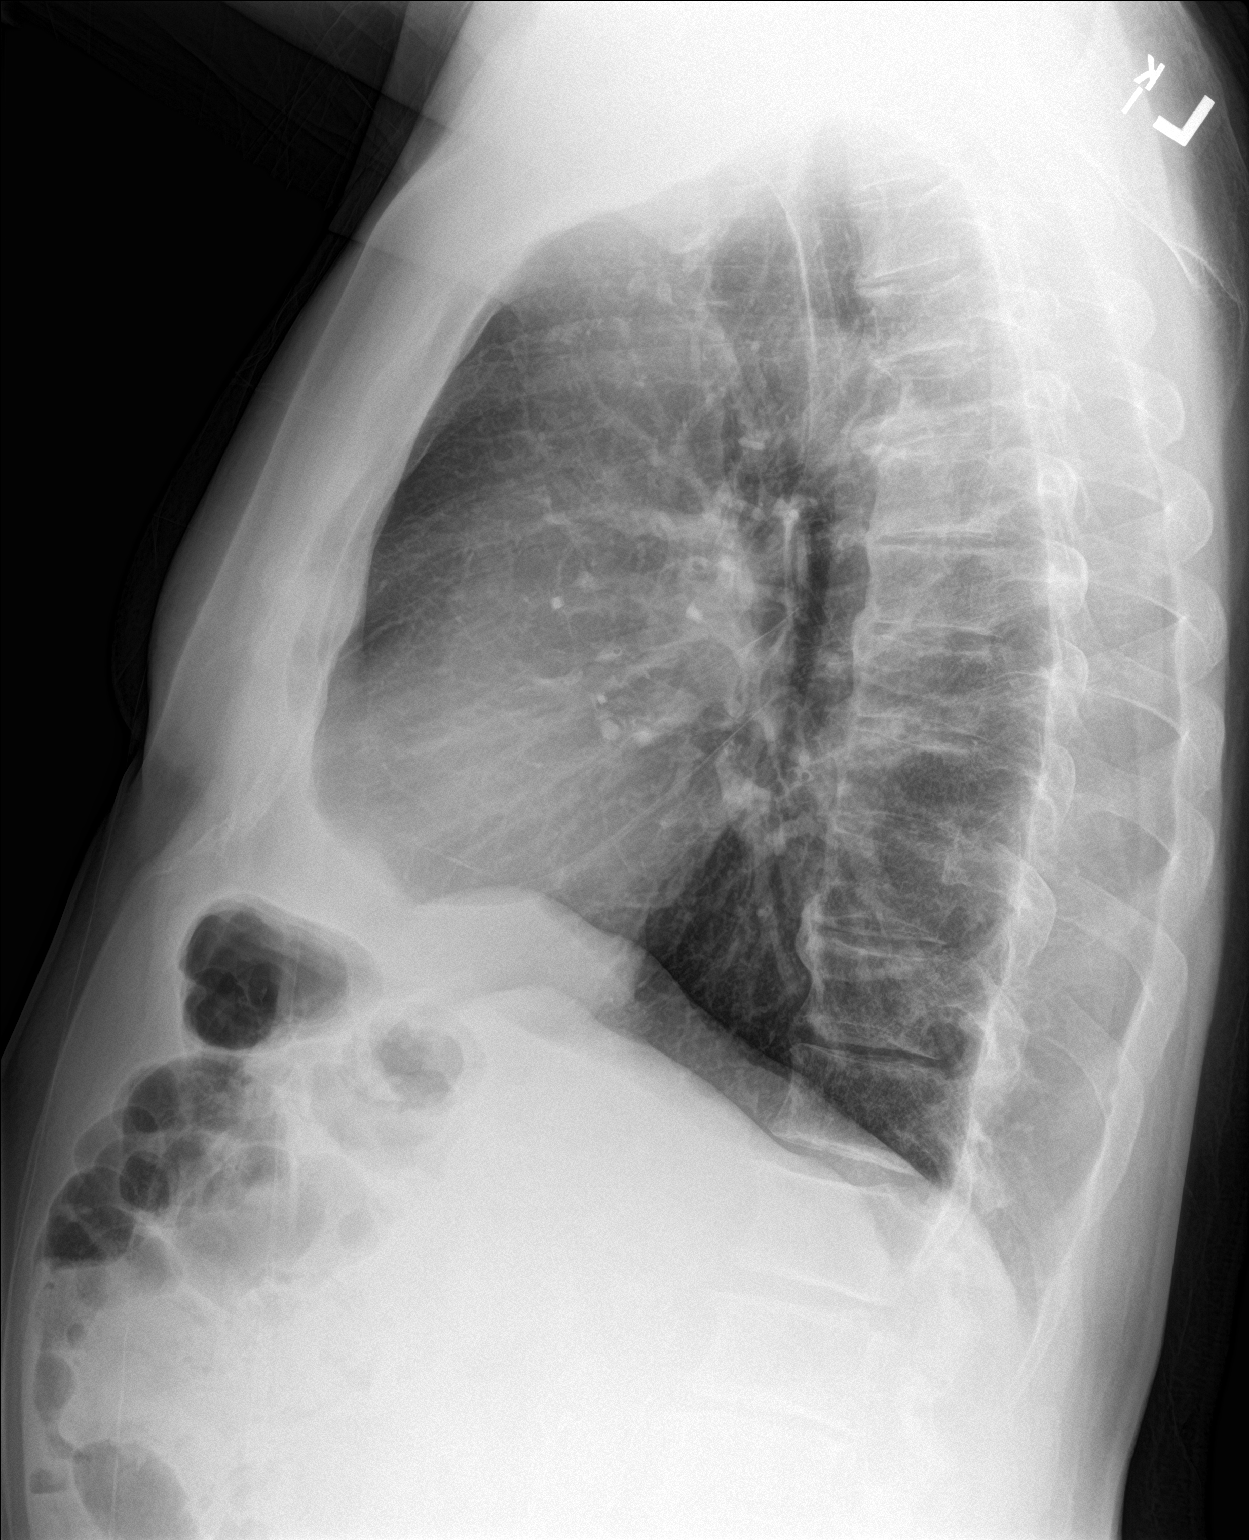

[2 of 2 positions shown; findings below may reference images not displayed]

FINDINGS: The lungs are clear. The heart size and pulmonary vascularity are
normal. No adenopathy. There is aortic atherosclerosis. There is
degenerative change in the thoracic spine.
IMPRESSION: Aortic atherosclerosis.  No edema or consolidation.

Aortic Atherosclerosis (OEDX9-GI8.8).

## 2019-08-11 DIAGNOSIS — M25521 Pain in right elbow: Secondary | ICD-10-CM | POA: Diagnosis not present

## 2019-08-11 DIAGNOSIS — G5601 Carpal tunnel syndrome, right upper limb: Secondary | ICD-10-CM | POA: Diagnosis not present

## 2019-08-12 ENCOUNTER — Ambulatory Visit: Payer: PPO | Attending: Internal Medicine

## 2019-08-12 ENCOUNTER — Other Ambulatory Visit: Payer: Self-pay

## 2019-08-12 DIAGNOSIS — Z23 Encounter for immunization: Secondary | ICD-10-CM

## 2019-08-12 NOTE — Progress Notes (Signed)
   Covid-19 Vaccination Clinic  Name:  Christopher Dudley    MRN: [id number] DOB: 1935-02-27  08/12/2019  [name] was observed post Covid-19 immunization for 15 minutes without incident. He was provided with Vaccine Information Sheet and instruction to access the V-Safe system.   [name] was instructed to call 911 with any severe reactions post vaccine: Marland Kitchen Difficulty breathing  . Swelling of face and throat  . A fast heartbeat  . A bad rash all over body  . Dizziness and weakness   Immunizations Administered    Name Date Dose VIS Date Route   Moderna COVID-19 Vaccine 08/12/2019 10:44 AM 0.5 mL 04/29/2019 Intramuscular   Manufacturer: Moderna   Lot: [id number]   [place]:[id number]

## 2019-09-22 DIAGNOSIS — M25521 Pain in right elbow: Secondary | ICD-10-CM | POA: Diagnosis not present

## 2019-09-22 DIAGNOSIS — G5621 Lesion of ulnar nerve, right upper limb: Secondary | ICD-10-CM | POA: Diagnosis not present

## 2019-12-24 DIAGNOSIS — M545 Low back pain: Secondary | ICD-10-CM | POA: Diagnosis not present

## 2019-12-31 ENCOUNTER — Other Ambulatory Visit: Payer: Self-pay | Admitting: Neurosurgery

## 2020-01-07 ENCOUNTER — Other Ambulatory Visit: Payer: Self-pay | Admitting: Neurosurgery

## 2020-01-19 ENCOUNTER — Encounter (HOSPITAL_COMMUNITY)
Admission: RE | Admit: 2020-01-19 | Discharge: 2020-01-19 | Disposition: A | Discharge status: Home / Self Care | Payer: No Typology Code available for payment source | Source: Ambulatory Visit | Attending: Neurosurgery | Admitting: Neurosurgery

## 2020-01-19 ENCOUNTER — Other Ambulatory Visit (HOSPITAL_COMMUNITY)
Admission: RE | Admit: 2020-01-19 | Discharge: 2020-01-19 | Disposition: A | Discharge status: Home / Self Care | Payer: No Typology Code available for payment source | Source: Ambulatory Visit | Attending: Neurosurgery | Admitting: Neurosurgery

## 2020-01-19 ENCOUNTER — Telehealth: Payer: Self-pay | Admitting: Internal Medicine

## 2020-01-19 ENCOUNTER — Other Ambulatory Visit: Payer: Self-pay

## 2020-01-19 ENCOUNTER — Encounter (HOSPITAL_COMMUNITY): Payer: Self-pay

## 2020-01-19 DIAGNOSIS — Z01812 Encounter for preprocedural laboratory examination: Secondary | ICD-10-CM | POA: Insufficient documentation

## 2020-01-19 DIAGNOSIS — I10 Essential (primary) hypertension: Secondary | ICD-10-CM | POA: Diagnosis not present

## 2020-01-19 DIAGNOSIS — Z20822 Contact with and (suspected) exposure to covid-19: Secondary | ICD-10-CM | POA: Insufficient documentation

## 2020-01-19 LAB — BASIC METABOLIC PANEL
Anion gap: 12 (ref 5–15)
BUN: 10 mg/dL (ref 8–23)
CO2: 25 mmol/L (ref 22–32)
Calcium: 8.9 mg/dL (ref 8.9–10.3)
Chloride: 100 mmol/L (ref 98–111)
Creatinine, Ser: 0.96 mg/dL (ref 0.61–1.24)
GFR calc Af Amer: >60 mL/min (ref >60)
GFR calc non Af Amer: >60 mL/min (ref >60)
Glucose, Bld: 113 mg/dL — ABNORMAL HIGH (ref 70–99)
Potassium: 4.9 mmol/L (ref 3.5–5.1)
Sodium: 137 mmol/L (ref 135–145)

## 2020-01-19 LAB — CBC
HCT: 47.7 % (ref 39.0–52.0)
Hemoglobin: 15.4 g/dL (ref 13.0–17.0)
MCH: 30.4 pg (ref 26.0–34.0)
MCHC: 32.3 g/dL (ref 30.0–36.0)
MCV: 94.1 fL (ref 80.0–100.0)
Platelets: 252 10*3/uL (ref 150–400)
RBC: 5.07 MIL/uL (ref 4.22–5.81)
RDW: 13.2 % (ref 11.5–15.5)
WBC: 9 10*3/uL (ref 4.0–10.5)
nRBC: 0 % (ref 0.0–0.2)

## 2020-01-19 LAB — SURGICAL PCR SCREEN
MRSA, PCR: NEGATIVE
Staphylococcus aureus: NEGATIVE

## 2020-01-19 LAB — SARS CORONAVIRUS 2 (TAT 6-24 HRS): SARS Coronavirus 2: NEGATIVE

## 2020-01-19 NOTE — Telephone Encounter (Signed)
Noted  

## 2020-01-19 NOTE — Progress Notes (Signed)
[id number] - [place], [place] [address] [place] [address] Phone: [phone] Fax: [phone]      Your procedure is scheduled on Wednesday 01/21/2020.  Report to [facility] Main Entrance "A" at 05:30 A.M., and check in at the Admitting office.  Call this number if you have problems the morning of surgery:  [phone]  Call [phone] if you have any questions prior to your surgery date Monday-Friday 8am-4pm    Remember:  Do not eat or drink after midnight the night before your surgery   Take these medicines the morning of surgery with A SIP OF WATER: Acetaminophen (Tylenol) - if needed  As of today, STOP taking any Aspirin (unless otherwise instructed by your surgeon) Aleve, Naproxen, Ibuprofen, Motrin, Advil, Goody's, BC's, all herbal medications, fish oil, and all vitamins.                      Do not wear jewelry            Do not wear lotions, powders, colognes, or deodorant.            Do not shave 48 hours prior to surgery.  Men may shave face and neck.            Do not bring valuables to the hospital.            [facility] is not responsible for any belongings or valuables.  Do NOT Smoke (Tobacco/Vaping) or drink Alcohol 24 hours prior to your procedure  If you use a CPAP at night, you may bring all equipment for your overnight stay.   Contacts, glasses, dentures or bridgework may not be worn into surgery.      For patients admitted to the hospital, discharge time will be determined by your treatment team.   Patients discharged the day of surgery will not be allowed to drive home, and someone needs to stay with them for 24 hours.    Special instructions:   [facility]- Preparing For Surgery  Before surgery, you can play an important role. Because skin is not sterile, your skin needs to be as free of germs as possible. You can reduce the number of germs on your skin by washing  with CHG (chlorahexidine gluconate) Soap before surgery.  CHG is an antiseptic cleaner which kills germs and bonds with the skin to continue killing germs even after washing.    Oral Hygiene is also important to reduce your risk of infection.  Remember - BRUSH YOUR TEETH THE MORNING OF SURGERY WITH YOUR REGULAR TOOTHPASTE  Please do not use if you have an allergy to CHG or antibacterial soaps. If your skin becomes reddened/irritated stop using the CHG.  Do not shave (including legs and underarms) for at least 48 hours prior to first CHG shower. It is OK to shave your face.  Please follow these instructions carefully.   1. Shower the NIGHT BEFORE SURGERY and the MORNING OF SURGERY with CHG Soap.   2. If you chose to wash your hair, wash your hair first as usual with your normal shampoo.  3. After you shampoo, rinse your hair and body thoroughly to remove the shampoo.  4. Use CHG as you would any other liquid soap. You can apply CHG directly to the skin and wash gently with a scrungie or a clean washcloth.   5. Apply the CHG Soap to your body ONLY FROM  THE NECK DOWN.  Do not use on open wounds or open sores. Avoid contact with your eyes, ears, mouth and genitals (private parts). Wash Face and genitals (private parts)  with your normal soap.   6. Wash thoroughly, paying special attention to the area where your surgery will be performed.  7. Thoroughly rinse your body with warm water from the neck down.  8. DO NOT shower/wash with your normal soap after using and rinsing off the CHG Soap.  9. Pat yourself dry with a CLEAN TOWEL.  10. Wear CLEAN PAJAMAS to bed the night before surgery  11. Place CLEAN SHEETS on your bed the night of your first shower and DO NOT SLEEP WITH PETS.   Day of Surgery: Shower with CHG soap as directed. Wear Clean/Comfortable clothing the morning of surgery Do not apply any deodorants/lotions.   Remember to brush your teeth WITH YOUR REGULAR TOOTHPASTE.    Please read over the following fact sheets that you were given.

## 2020-01-19 NOTE — Progress Notes (Signed)
PCP - Dr. Scarlette Calico; also a patient with the [place] Cardiologist - patient denies  PPM/ICD - n/a Device Orders -  Rep Notified -   Chest x-ray - n/a EKG - 02/05/2019  Stress Test - patient denies ECHO - patient denies Cardiac Cath - patient denies  Sleep Study - patient denies CPAP -   Fasting Blood Sugar - n/a Checks Blood Sugar _____ times a day  Blood Thinner Instructions: n/a Aspirin Instructions: n/a  ERAS Protcol - n/a NPO p MN per MD order PRE-SURGERY Ensure or G2-   COVID TEST- after PAT appointment   Anesthesia review: elevated BP at PAT appointment.  James aware.  Patient denies shortness of breath, fever, cough and chest pain at PAT appointment.  Patient arrived to PAT with elevated HR and BP.  Rechecked after appointment and BP and HR continued to be elevated.  Informed patient that he needed to contact his PCP to let his PCP know what his BPs have been today.  If he comes in the DOS with BPs 180s/100s, he runs the risk of being cancelled.  Jeneen Rinks has a call into Dr. Adline Mango office.   All instructions explained to the patient, with a verbal understanding of the material. Patient agrees to go over the instructions while at home for a better understanding. Patient also instructed to self quarantine after being tested for COVID-19. The opportunity to ask questions was provided.

## 2020-01-19 NOTE — Telephone Encounter (Signed)
    Patient calling to report while at pre admission testing today his BP was elevated (179/97) Advised to see PCP prior to upcoming surgery scheduled for 8/25

## 2020-01-20 ENCOUNTER — Encounter: Payer: Self-pay | Admitting: Internal Medicine

## 2020-01-20 ENCOUNTER — Ambulatory Visit (INDEPENDENT_AMBULATORY_CARE_PROVIDER_SITE_OTHER): Payer: No Typology Code available for payment source | Admitting: Internal Medicine

## 2020-01-20 ENCOUNTER — Other Ambulatory Visit: Payer: Self-pay

## 2020-01-20 VITALS — BP 182/96 | HR 96 | Temp 98.2°F | Ht 66.0 in | Wt 202.0 lb

## 2020-01-20 DIAGNOSIS — I10 Essential (primary) hypertension: Secondary | ICD-10-CM | POA: Diagnosis not present

## 2020-01-20 MED ORDER — EDARBYCLOR 40-12.5 MG PO TABS: 1.0000 | ORAL_TABLET | Freq: Every day | ORAL | 0 refills | Status: DC

## 2020-01-20 NOTE — Anesthesia Preprocedure Evaluation (Addendum)
Anesthesia Evaluation  Patient identified by MRN, date of birth, ID band Patient awake    Reviewed: Allergy & Precautions, NPO status , Patient's Chart, lab work & pertinent test results  Airway Mallampati: II  TM Distance: >3 FB Neck ROM: Full    Dental  (+) Dental Advisory Given   Pulmonary neg pulmonary ROS,    breath sounds clear to auscultation       Cardiovascular hypertension, Pt. on medications  Rhythm:Regular Rate:Normal     Neuro/Psych Spinal stenosis    GI/Hepatic negative GI ROS, Neg liver ROS,   Endo/Other  negative endocrine ROS  Renal/GU negative Renal ROS     Musculoskeletal   Abdominal   Peds  Hematology negative hematology ROS (+)   Anesthesia Other Findings   Reproductive/Obstetrics                            Lab Results  Component Value Date   WBC 9.0 01/19/2020   HGB 15.4 01/19/2020   HCT 47.7 01/19/2020   MCV 94.1 01/19/2020   PLT 252 01/19/2020   Lab Results  Component Value Date   CREATININE 0.96 01/19/2020   BUN 10 01/19/2020   NA 137 01/19/2020   K 4.9 01/19/2020   CL 100 01/19/2020   CO2 25 01/19/2020    Anesthesia Physical Anesthesia Plan  ASA: III  Anesthesia Plan: General   Post-op Pain Management:    Induction: Intravenous  PONV Risk Score and Plan: 2 and Dexamethasone, Ondansetron and Treatment may vary due to age or medical condition  Airway Management Planned: Oral ETT  Additional Equipment: None  Intra-op Plan:   Post-operative Plan: Extubation in OR  Informed Consent: I have reviewed the patients History and Physical, chart, labs and discussed the procedure including the risks, benefits and alternatives for the proposed anesthesia with the patient or authorized representative who has indicated his/her understanding and acceptance.     Dental advisory given  Plan Discussed with: CRNA  Anesthesia Plan Comments:        Anesthesia Quick Evaluation

## 2020-01-20 NOTE — Patient Instructions (Signed)

## 2020-01-20 NOTE — Progress Notes (Signed)
Subjective:  Patient ID: Christopher Dudley, male    DOB: [birth year]  Age: 85 y.o. MRN: [id number]  CC: Hypertension  This visit occurred during the SARS-CoV-2 public health emergency.  Safety protocols were in place, including screening questions prior to the visit, additional usage of staff PPE, and extensive cleaning of exam room while observing appropriate contact time as indicated for disinfecting solutions.    HPI Christopher Dudley presents for f/up -1 day prior to this visit he was seen for a preop visit.  He is scheduled for surgery for lumbar spinal stenosis tomorrow.  He was told that his blood pressure is too high for surgery.  He has a history of hypertension but prior to this time he has not been willing to take an antihypertensive.  He has had too much pain to work on his lifestyle modifications.  He denies headache, blurred vision, chest pain, shortness of breath, palpitations, edema, or fatigue.  No facility-administered medications prior to visit.   Outpatient Medications Prior to Visit  Medication Sig Dispense Refill  . acetaminophen (TYLENOL) 500 MG tablet Take 500-1,000 mg by mouth 2 (two) times daily as needed (back pain.).     Marland Kitchen Cholecalciferol (VITAMIN D3 PO) Take 1 tablet by mouth daily.     . Multiple Vitamin (MULTIVITAMIN WITH MINERALS) TABS tablet Take 1 tablet by mouth daily. (Patient not taking: Reported on 01/20/2020)    . Ascorbic Acid (VITAMIN C PO) Take 1 tablet by mouth daily. (Patient not taking: Reported on 01/20/2020)    . ibuprofen (ADVIL) 200 MG tablet Take 400-600 mg by mouth 2 (two) times daily as needed (back pain.). (Patient not taking: Reported on 01/20/2020)      ROS Review of Systems  Constitutional: Negative for appetite change, diaphoresis and fatigue.  HENT: Negative.   Eyes: Negative for visual disturbance.  Respiratory: Negative for cough, chest tightness, shortness of breath and wheezing.   Cardiovascular: Negative for chest pain,  palpitations and leg swelling.  Gastrointestinal: Negative for abdominal pain, constipation, diarrhea, nausea and vomiting.  Endocrine: Negative.   Genitourinary: Negative.  Negative for difficulty urinating, dysuria and hematuria.  Musculoskeletal: Positive for back pain. Negative for myalgias.  Skin: Negative.  Negative for color change, pallor and rash.  Neurological: Negative.  Negative for dizziness, weakness and light-headedness.  Hematological: Negative for adenopathy. Does not bruise/bleed easily.  Psychiatric/Behavioral: Negative.     Objective:  BP (!) 182/96 (BP Location: Right Arm, Cuff Size: Normal)   Pulse 96   Temp 98.2 F (36.8 C) (Oral)   Ht 5\' 6"  (1.676 m)   Wt 202 lb (91.6 kg)   SpO2 97%   BMI 32.60 kg/m   BP Readings from Last 3 Encounters:  01/21/20 (!) 173/90  01/20/20 (!) 182/96  01/19/20 (!) 189/97    Wt Readings from Last 3 Encounters:  01/21/20 202 lb (91.6 kg)  01/20/20 202 lb (91.6 kg)  01/19/20 202 lb 3.2 oz (91.7 kg)    Physical Exam Vitals reviewed.  Constitutional:      Appearance: He is obese.  HENT:     Nose: Nose normal.     Mouth/Throat:     Mouth: Mucous membranes are moist.  Eyes:     General: No scleral icterus.    Conjunctiva/sclera: Conjunctivae normal.  Cardiovascular:     Rate and Rhythm: Normal rate.     Heart sounds: No murmur heard.  No gallop.      Comments: EKG - Sinus rhythm  with PAC's, 86 bpm No LVH or Q waves Normal EKG Pulmonary:     Effort: Pulmonary effort is normal.     Breath sounds: No stridor. No wheezing, rhonchi or rales.  Abdominal:     General: Abdomen is protuberant. Bowel sounds are normal. There is no distension.     Palpations: Abdomen is soft. There is no hepatomegaly, splenomegaly or mass.     Tenderness: There is no abdominal tenderness.  Musculoskeletal:        General: Normal range of motion.     Cervical back: Neck supple.     Right lower leg: 1+ Edema present.     Left lower leg:  Edema (trace) present.  Lymphadenopathy:     Cervical: No cervical adenopathy.  Skin:    General: Skin is warm and dry.     Coloration: Skin is not pale.  Neurological:     General: No focal deficit present.     Mental Status: He is alert.      Lab Results  Component Value Date   WBC 9.0 01/19/2020   HGB 15.4 01/19/2020   HCT 47.7 01/19/2020   PLT 252 01/19/2020   GLUCOSE 113 (H) 01/19/2020   CHOL 172 07/04/2017   TRIG 73 07/04/2017   HDL 50 07/04/2017   LDLCALC 115 07/17/2018   ALT 27 07/17/2018   AST 17 07/17/2018   NA 137 01/19/2020   K 4.9 01/19/2020   CL 100 01/19/2020   CREATININE 0.96 01/19/2020   BUN 10 01/19/2020   CO2 25 01/19/2020   TSH 1.06 01/20/2020   PSA 4.27 01/24/2019   MICROALBUR 0.598 07/04/2017    No results found.  Assessment & Plan:   Christopher Dudley was seen today for hypertension.  Diagnoses and all orders for this visit:  Essential hypertension- His blood pressure is not well controlled.  Labs/EKG are negative for secondary causes or endorgan damage.  I recommended that he start taking the combination of an ARB and thiazide diuretic. -     TSH; Future -     Urinalysis, Routine w reflex microscopic; Future -     Azilsartan-Chlorthalidone (EDARBYCLOR) 40-12.5 MG TABS; Take 1 tablet by mouth daily. -     Urinalysis, Routine w reflex microscopic -     TSH  Other orders -     MICROSCOPIC MESSAGE   I have discontinued Emeline Gins. Fussell "[name]"'s ibuprofen and Ascorbic Acid (VITAMIN C PO). I am also having him start on Edarbyclor. Additionally, I am having him maintain his multivitamin with minerals, acetaminophen, and Cholecalciferol (VITAMIN D3 PO).  Meds ordered this encounter  Medications  . Azilsartan-Chlorthalidone (EDARBYCLOR) 40-12.5 MG TABS    Sig: Take 1 tablet by mouth daily.    Dispense:  35 tablet    Refill:  0     Follow-up: Return in about 4 weeks (around 02/17/2020).  Scarlette Calico, MD

## 2020-01-20 NOTE — Progress Notes (Signed)
Anesthesia Chart Review:  Patient noted to have uncontrolled hypertension at preadmission testing.  I advised him to follow-up with PCP ASAP as his blood pressure would likely preclude elective surgery.  He was seen by PCP Dr. Scarlette Calico on 01/20/2020 and started on combination Azilsartan-chlorthalidone 40-12.5. He took his first dose at 3:30 pm on 8/24.  Discussed with Dr. Glennon Mac, patient will not take an additional dose tomorrow morning.  She advised that if needed blood pressure could be managed on day of surgery.  I called patient to make him aware and he verbalized understanding.  Preop labs reviewed, unremarkable.  EKG 02/05/2019: NSR.  Rate 74.  BP Readings from Last 3 Encounters:  01/20/20 (!) 182/96  01/19/20 (!) 189/97  02/05/19 (!) 162/84    Karoline Caldwell, PA-C [facility]Anesthesiology Phone [phone] 01/20/2020 4:55 PM

## 2020-01-21 ENCOUNTER — Ambulatory Visit (HOSPITAL_COMMUNITY): Payer: No Typology Code available for payment source

## 2020-01-21 ENCOUNTER — Ambulatory Visit (HOSPITAL_COMMUNITY): Payer: No Typology Code available for payment source | Admitting: Anesthesiology

## 2020-01-21 ENCOUNTER — Other Ambulatory Visit: Payer: Self-pay

## 2020-01-21 ENCOUNTER — Encounter (HOSPITAL_COMMUNITY)
Admission: RE | Disposition: A | Discharge status: Home / Self Care | Payer: Self-pay | Source: Home / Self Care | Attending: Neurosurgery

## 2020-01-21 ENCOUNTER — Ambulatory Visit (HOSPITAL_COMMUNITY): Payer: No Typology Code available for payment source | Admitting: Physician Assistant

## 2020-01-21 ENCOUNTER — Ambulatory Visit (HOSPITAL_COMMUNITY)
Admission: RE | Admit: 2020-01-21 | Discharge: 2020-01-21 | Disposition: A | Discharge status: Home / Self Care | Payer: No Typology Code available for payment source | Attending: Neurosurgery | Admitting: Neurosurgery

## 2020-01-21 ENCOUNTER — Encounter (HOSPITAL_COMMUNITY): Payer: Self-pay | Admitting: Neurosurgery

## 2020-01-21 DIAGNOSIS — Z8249 Family history of ischemic heart disease and other diseases of the circulatory system: Secondary | ICD-10-CM | POA: Insufficient documentation

## 2020-01-21 DIAGNOSIS — M48062 Spinal stenosis, lumbar region with neurogenic claudication: Secondary | ICD-10-CM | POA: Diagnosis not present

## 2020-01-21 DIAGNOSIS — M5416 Radiculopathy, lumbar region: Secondary | ICD-10-CM | POA: Insufficient documentation

## 2020-01-21 DIAGNOSIS — Z981 Arthrodesis status: Secondary | ICD-10-CM | POA: Diagnosis not present

## 2020-01-21 DIAGNOSIS — I10 Essential (primary) hypertension: Secondary | ICD-10-CM | POA: Diagnosis not present

## 2020-01-21 DIAGNOSIS — Z419 Encounter for procedure for purposes other than remedying health state, unspecified: Secondary | ICD-10-CM

## 2020-01-21 HISTORY — PX: LUMBAR LAMINECTOMY/DECOMPRESSION MICRODISCECTOMY: SHX5026

## 2020-01-21 LAB — URINALYSIS, ROUTINE W REFLEX MICROSCOPIC
Bacteria, UA: NONE SEEN /HPF
Bilirubin Urine: NEGATIVE
Glucose, UA: NEGATIVE
Hgb urine dipstick: NEGATIVE
Hyaline Cast: NONE SEEN /LPF
Nitrite: NEGATIVE
Protein, ur: NEGATIVE
RBC / HPF: NONE SEEN /HPF (ref 0–2)
Specific Gravity, Urine: 1.01 (ref 1.001–1.03)
Squamous Epithelial / HPF: NONE SEEN /HPF (ref <=5)
pH: 6.5 (ref 5.0–8.0)

## 2020-01-21 LAB — TSH: TSH: 1.06 mIU/L (ref 0.40–4.50)

## 2020-01-21 SURGERY — LUMBAR LAMINECTOMY/DECOMPRESSION MICRODISCECTOMY 2 LEVELS
Anesthesia: General | Laterality: Bilateral

## 2020-01-21 MED ORDER — LIDOCAINE 2% (20 MG/ML) 5 ML SYRINGE
INTRAMUSCULAR | Status: AC
  Filled 2020-01-21: qty 5

## 2020-01-21 MED ORDER — DEXAMETHASONE SODIUM PHOSPHATE 10 MG/ML IJ SOLN
INTRAMUSCULAR | Status: DC | PRN
  Administered 2020-01-21: 10 mg via INTRAVENOUS

## 2020-01-21 MED ORDER — BUPIVACAINE LIPOSOME 1.3 % IJ SUSP
20.0000 mL | Freq: Once | INTRAMUSCULAR | Status: DC
  Filled 2020-01-21: qty 20

## 2020-01-21 MED ORDER — ORAL CARE MOUTH RINSE: 15.0000 mL | Freq: Once | OROMUCOSAL | Status: AC

## 2020-01-21 MED ORDER — LACTATED RINGERS IV SOLN: INTRAVENOUS | Status: DC

## 2020-01-21 MED ORDER — ONDANSETRON HCL 4 MG/2ML IJ SOLN
INTRAMUSCULAR | Status: DC | PRN
  Administered 2020-01-21: 4 mg via INTRAVENOUS

## 2020-01-21 MED ORDER — ONDANSETRON HCL 4 MG/2ML IJ SOLN
INTRAMUSCULAR | Status: AC
  Filled 2020-01-21: qty 2

## 2020-01-21 MED ORDER — THROMBIN 5000 UNITS EX SOLR
CUTANEOUS | Status: AC
  Filled 2020-01-21: qty 10000

## 2020-01-21 MED ORDER — CYCLOBENZAPRINE HCL 10 MG PO TABS: 10.0000 mg | ORAL_TABLET | Freq: Three times a day (TID) | ORAL | 0 refills | Status: DC | PRN

## 2020-01-21 MED ORDER — BUPIVACAINE-EPINEPHRINE (PF) 0.5% -1:200000 IJ SOLN
INTRAMUSCULAR | Status: DC | PRN
  Administered 2020-01-21: 10 mL via PERINEURAL

## 2020-01-21 MED ORDER — DEXAMETHASONE SODIUM PHOSPHATE 10 MG/ML IJ SOLN
INTRAMUSCULAR | Status: AC
  Filled 2020-01-21: qty 1

## 2020-01-21 MED ORDER — THROMBIN 5000 UNITS EX SOLR
OROMUCOSAL | Status: DC | PRN
  Administered 2020-01-21: 5 mL via TOPICAL

## 2020-01-21 MED ORDER — BUPIVACAINE LIPOSOME 1.3 % IJ SUSP
INTRAMUSCULAR | Status: DC | PRN
  Administered 2020-01-21: 20 mL

## 2020-01-21 MED ORDER — PHENYLEPHRINE HCL-NACL 10-0.9 MG/250ML-% IV SOLN
INTRAVENOUS | Status: DC | PRN
  Administered 2020-01-21: 50 ug/min via INTRAVENOUS

## 2020-01-21 MED ORDER — SUGAMMADEX SODIUM 200 MG/2ML IV SOLN
INTRAVENOUS | Status: DC | PRN
  Administered 2020-01-21: 200 mg via INTRAVENOUS

## 2020-01-21 MED ORDER — BUPIVACAINE-EPINEPHRINE 0.5% -1:200000 IJ SOLN
INTRAMUSCULAR | Status: AC
  Filled 2020-01-21: qty 1

## 2020-01-21 MED ORDER — CHLORHEXIDINE GLUCONATE 0.12 % MT SOLN
15.0000 mL | Freq: Once | OROMUCOSAL | Status: AC
  Administered 2020-01-21: 15 mL via OROMUCOSAL

## 2020-01-21 MED ORDER — PROPOFOL 10 MG/ML IV BOLUS
INTRAVENOUS | Status: AC
  Filled 2020-01-21: qty 20

## 2020-01-21 MED ORDER — THROMBIN 5000 UNITS EX SOLR
CUTANEOUS | Status: AC
  Filled 2020-01-21: qty 5000

## 2020-01-21 MED ORDER — CEFAZOLIN SODIUM-DEXTROSE 2-4 GM/100ML-% IV SOLN
2.0000 g | INTRAVENOUS | Status: AC
  Administered 2020-01-21: 2 g via INTRAVENOUS

## 2020-01-21 MED ORDER — ROCURONIUM BROMIDE 10 MG/ML (PF) SYRINGE
PREFILLED_SYRINGE | INTRAVENOUS | Status: DC | PRN
  Administered 2020-01-21: 60 mg via INTRAVENOUS
  Administered 2020-01-21: 20 mg via INTRAVENOUS

## 2020-01-21 MED ORDER — CHLORHEXIDINE GLUCONATE CLOTH 2 % EX PADS: 6.0000 | MEDICATED_PAD | Freq: Once | CUTANEOUS | Status: DC

## 2020-01-21 MED ORDER — 0.9 % SODIUM CHLORIDE (POUR BTL) OPTIME
TOPICAL | Status: DC | PRN
  Administered 2020-01-21: 1000 mL

## 2020-01-21 MED ORDER — LACTATED RINGERS IV SOLN: INTRAVENOUS | Status: DC | PRN

## 2020-01-21 MED ORDER — SODIUM CHLORIDE 0.9 % IV SOLN
INTRAVENOUS | Status: DC | PRN
  Administered 2020-01-21: 500 mL

## 2020-01-21 MED ORDER — ROCURONIUM BROMIDE 10 MG/ML (PF) SYRINGE
PREFILLED_SYRINGE | INTRAVENOUS | Status: AC
  Filled 2020-01-21: qty 10

## 2020-01-21 MED ORDER — BACITRACIN ZINC 500 UNIT/GM EX OINT
TOPICAL_OINTMENT | CUTANEOUS | Status: AC
  Filled 2020-01-21: qty 28.35

## 2020-01-21 MED ORDER — LIDOCAINE 2% (20 MG/ML) 5 ML SYRINGE
INTRAMUSCULAR | Status: DC | PRN
  Administered 2020-01-21: 100 mg via INTRAVENOUS

## 2020-01-21 MED ORDER — ACETAMINOPHEN 500 MG PO TABS
1000.0000 mg | ORAL_TABLET | Freq: Once | ORAL | Status: AC
  Administered 2020-01-21: 1000 mg via ORAL
  Filled 2020-01-21: qty 2

## 2020-01-21 MED ORDER — FENTANYL CITRATE (PF) 250 MCG/5ML IJ SOLN
INTRAMUSCULAR | Status: AC
  Filled 2020-01-21: qty 5

## 2020-01-21 MED ORDER — FENTANYL CITRATE (PF) 100 MCG/2ML IJ SOLN
INTRAMUSCULAR | Status: DC | PRN
Start: 2020-01-21 — End: 2020-01-21
  Administered 2020-01-21 (×2): 50 ug via INTRAVENOUS
  Administered 2020-01-21: 100 ug via INTRAVENOUS

## 2020-01-21 MED ORDER — PROPOFOL 10 MG/ML IV BOLUS
INTRAVENOUS | Status: DC | PRN
  Administered 2020-01-21: 100 mg via INTRAVENOUS

## 2020-01-21 SURGICAL SUPPLY — 62 items
ADH SKN CLS APL DERMABOND .7 (GAUZE/BANDAGES/DRESSINGS) ×1
APL SKNCLS STERI-STRIP NONHPOA (GAUZE/BANDAGES/DRESSINGS) ×1
BAG DECANTER FOR FLEXI CONT (MISCELLANEOUS) ×3 IMPLANT
BAND INSRT 18 STRL LF DISP RB (MISCELLANEOUS) ×2
BAND RUBBER #18 3X1/16 STRL (MISCELLANEOUS) ×6 IMPLANT
BENZOIN TINCTURE PRP APPL 2/3 (GAUZE/BANDAGES/DRESSINGS) ×3 IMPLANT
BLADE CLIPPER SURG (BLADE) ×2 IMPLANT
BUR MATCHSTICK NEURO 3.0 LAGG (BURR) ×3 IMPLANT
BUR PRECISION FLUTE 6.0 (BURR) ×3 IMPLANT
CANISTER SUCT 3000ML PPV (MISCELLANEOUS) ×3 IMPLANT
CARTRIDGE OIL MAESTRO DRILL (MISCELLANEOUS) ×1 IMPLANT
CLOSURE WOUND 1/2 X4 (GAUZE/BANDAGES/DRESSINGS) ×1
COVER WAND RF STERILE (DRAPES) ×3 IMPLANT
DERMABOND ADVANCED (GAUZE/BANDAGES/DRESSINGS) ×2
DERMABOND ADVANCED .7 DNX12 (GAUZE/BANDAGES/DRESSINGS) IMPLANT
DIFFUSER DRILL AIR PNEUMATIC (MISCELLANEOUS) ×3 IMPLANT
DRAPE LAPAROTOMY 100X72X124 (DRAPES) ×3 IMPLANT
DRAPE MICROSCOPE LEICA (MISCELLANEOUS) ×3 IMPLANT
DRAPE SURG 17X23 STRL (DRAPES) ×12 IMPLANT
DRSG OPSITE POSTOP 4X6 (GAUZE/BANDAGES/DRESSINGS) ×2 IMPLANT
ELECT BLADE 4.0 EZ CLEAN MEGAD (MISCELLANEOUS) ×3
ELECT REM PT RETURN 9FT ADLT (ELECTROSURGICAL) ×3
ELECTRODE BLDE 4.0 EZ CLN MEGD (MISCELLANEOUS) ×1 IMPLANT
ELECTRODE REM PT RTRN 9FT ADLT (ELECTROSURGICAL) ×1 IMPLANT
GAUZE 4X4 16PLY RFD (DISPOSABLE) IMPLANT
GAUZE SPONGE 4X4 12PLY STRL (GAUZE/BANDAGES/DRESSINGS) ×3 IMPLANT
GLOVE BIO SURGEON STRL SZ 6.5 (GLOVE) ×1 IMPLANT
GLOVE BIO SURGEON STRL SZ7 (GLOVE) ×2 IMPLANT
GLOVE BIO SURGEON STRL SZ8 (GLOVE) ×3 IMPLANT
GLOVE BIO SURGEON STRL SZ8.5 (GLOVE) ×3 IMPLANT
GLOVE BIO SURGEONS STRL SZ 6.5 (GLOVE) ×1
GLOVE BIOGEL PI IND STRL 6.5 (GLOVE) IMPLANT
GLOVE BIOGEL PI IND STRL 7.5 (GLOVE) IMPLANT
GLOVE BIOGEL PI IND STRL 8 (GLOVE) IMPLANT
GLOVE BIOGEL PI INDICATOR 6.5 (GLOVE) ×2
GLOVE BIOGEL PI INDICATOR 7.5 (GLOVE) ×2
GLOVE BIOGEL PI INDICATOR 8 (GLOVE) ×2
GLOVE EXAM NITRILE XL STR (GLOVE) IMPLANT
GOWN STRL REUS W/ TWL LRG LVL3 (GOWN DISPOSABLE) IMPLANT
GOWN STRL REUS W/ TWL XL LVL3 (GOWN DISPOSABLE) ×1 IMPLANT
GOWN STRL REUS W/TWL 2XL LVL3 (GOWN DISPOSABLE) ×2 IMPLANT
GOWN STRL REUS W/TWL LRG LVL3 (GOWN DISPOSABLE) ×3
GOWN STRL REUS W/TWL XL LVL3 (GOWN DISPOSABLE) ×6
HEMOSTAT POWDER SURGIFOAM 1G (HEMOSTASIS) ×2 IMPLANT
KIT BASIN OR (CUSTOM PROCEDURE TRAY) ×3 IMPLANT
KIT TURNOVER KIT B (KITS) ×3 IMPLANT
NDL HYPO 21X1.5 SAFETY (NEEDLE) IMPLANT
NEEDLE HYPO 21X1.5 SAFETY (NEEDLE) ×3 IMPLANT
NEEDLE HYPO 22GX1.5 SAFETY (NEEDLE) ×3 IMPLANT
NS IRRIG 1000ML POUR BTL (IV SOLUTION) ×3 IMPLANT
OIL CARTRIDGE MAESTRO DRILL (MISCELLANEOUS) ×3
PACK LAMINECTOMY NEURO (CUSTOM PROCEDURE TRAY) ×3 IMPLANT
PAD ARMBOARD 7.5X6 YLW CONV (MISCELLANEOUS) ×7 IMPLANT
PATTIES SURGICAL .5 X1 (DISPOSABLE) IMPLANT
SPONGE SURGIFOAM ABS GEL SZ50 (HEMOSTASIS) ×1 IMPLANT
STRIP CLOSURE SKIN 1/2X4 (GAUZE/BANDAGES/DRESSINGS) ×2 IMPLANT
SUT VIC AB 1 CT1 18XBRD ANBCTR (SUTURE) ×2 IMPLANT
SUT VIC AB 1 CT1 8-18 (SUTURE) ×6
SUT VIC AB 2-0 CP2 18 (SUTURE) ×6 IMPLANT
TOWEL GREEN STERILE (TOWEL DISPOSABLE) ×3 IMPLANT
TOWEL GREEN STERILE FF (TOWEL DISPOSABLE) ×3 IMPLANT
WATER STERILE IRR 1000ML POUR (IV SOLUTION) ×3 IMPLANT

## 2020-01-21 NOTE — Discharge Instructions (Signed)

## 2020-01-21 NOTE — Anesthesia Postprocedure Evaluation (Signed)
Anesthesia Post Note  Patient: JESUS NEVILLS  Procedure(s) Performed: LAMINECTOMY AND FORAMINOTOMY BILATERAL, LUMBAR THREE- LUMBAR FOUR, LUMBAR FOUR- LUMBAR FIVE (Bilateral )     Patient location during evaluation: PACU Anesthesia Type: General Level of consciousness: awake and alert Pain management: pain level controlled Vital Signs Assessment: post-procedure vital signs reviewed and stable Respiratory status: spontaneous breathing, nonlabored ventilation, respiratory function stable and patient connected to nasal cannula oxygen Cardiovascular status: blood pressure returned to baseline and stable Postop Assessment: no apparent nausea or vomiting Anesthetic complications: no   No complications documented.  Last Vitals:  Vitals:   01/21/20 1200 01/21/20 1351  BP: (!) 141/89 (!) 147/94  Pulse: 87 96  Resp: 18 19  Temp:    SpO2: 96% 96%    Last Pain:  Vitals:   01/21/20 1200  TempSrc:   PainSc: 0-No pain                 Tiajuana Amass

## 2020-01-21 NOTE — H&P (Signed)
Subjective: The patient is an 85 year old white male who has complained of back and left great and right leg pain consistent with neurogenic claudication.  He has failed medical management.  He was worked up with a lumbar MRI which demonstrated severe spinal stenosis at L3-4 and L4-5.  I discussed the various treatment options with him.  He has decided to proceed with surgery.  Past Medical History:  Diagnosis Date  . Alcohol abuse    hx of  . Blood in stool    hx of  . 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 Colonic polyp 3-07    Past Surgical History:  Procedure Laterality Date  . INGUINAL HERNIA REPAIR  2001   bilateral, left done in 2006  . KNEE ARTHROSCOPY W/ MENISCAL REPAIR  '10    Right. [facility])  . LIGAMENT REPAIR Right    right arm  . pilondial cyst excision  85 yrs old  . ROTATOR CUFF REPAIR  07    No Known Allergies  Social History   Tobacco Use  . Smoking status: Never Smoker  . Smokeless tobacco: Never Used  Substance Use Topics  . Alcohol use: No    Comment: in reocver since 2001    Family History  Problem Relation Age of Onset  . Cancer Mother        ovarian  . Coronary artery disease Father   . Heart attack Father   . Gallbladder disease Father   . Hypertension Father   . Cancer Father        prostate  . Heart disease Father        CAD/MI-fatal  . Diabetes Neg Hx    Prior to Admission medications   Medication Sig Start Date End Date Taking? Authorizing Provider  acetaminophen (TYLENOL) 500 MG tablet Take 500-1,000 mg by mouth 2 (two) times daily as needed (back pain.).    Yes [provider]  Azilsartan-Chlorthalidone (EDARBYCLOR) 40-12.5 MG TABS Take 1 tablet by mouth daily. 01/20/20  Yes Janith Lima, MD  Cholecalciferol (VITAMIN D3 PO) Take 1 tablet by mouth daily.    Yes [provider]  Multiple Vitamin (MULTIVITAMIN WITH MINERALS) TABS tablet Take 1 tablet by mouth daily. Patient not taking: Reported on 01/20/2020    [provider]     Review of Systems  Positive ROS: As above  All other systems have been reviewed and were otherwise negative with the exception of those mentioned in the HPI and as above.  Objective: Vital signs in last 24 hours: Temp:  [97.4 F (36.3 C)-98.2 F (36.8 C)] 97.4 F (36.3 C) (08/25 0542) Pulse Rate:  [90-96] 90 (08/25 0542) Resp:  [18] 18 (08/25 0542) BP: (173-196)/(90-112) 173/90 (08/25 0542) SpO2:  [97 %-99 %] 99 % (08/25 0542) Weight:  [91.6 kg] 91.6 kg (08/25 0621) Estimated body mass index is 32.6 kg/m as calculated from the following:   Height as of this encounter: 5\' 6"  (1.676 m).   Weight as of this encounter: 91.6 kg.   General Appearance: Alert Head: Normocephalic, without obvious abnormality, atraumatic Eyes: PERRL, conjunctiva/corneas clear, EOM's intact,    Ears: Normal  Throat: Normal  Neck: Supple, Back: unremarkable Lungs: Clear to auscultation bilaterally, respirations unlabored Heart: Regular rate and rhythm, no murmur, rub or gallop Abdomen: Soft, non-tender Extremities: Extremities normal, atraumatic, no cyanosis or edema Skin: unremarkable  NEUROLOGIC:   Mental status: alert and oriented,Motor Exam - grossly normal Sensory Exam - grossly normal  Reflexes:  Coordination - grossly normal Gait - grossly normal Balance - grossly normal Cranial Nerves: I: smell Not tested  II: visual acuity  OS: Normal  OD: Normal   II: visual fields Full to confrontation  II: pupils Equal, round, reactive to light  III,VII: ptosis None  III,IV,VI: extraocular muscles  Full ROM  V: mastication Normal  V: facial light touch sensation  Normal  V,VII: corneal reflex  Present  VII: facial muscle function - upper  Normal  VII: facial muscle function - lower Normal  VIII: hearing Not tested  IX: soft palate elevation  Normal  IX,X: gag reflex Present  XI: trapezius strength  5/5  XI: sternocleidomastoid strength 5/5  XI: neck flexion strength   5/5  XII: tongue strength  Normal    Data Review Lab Results  Component Value Date   WBC 9.0 01/19/2020   HGB 15.4 01/19/2020   HCT 47.7 01/19/2020   MCV 94.1 01/19/2020   PLT 252 01/19/2020   Lab Results  Component Value Date   NA 137 01/19/2020   K 4.9 01/19/2020   CL 100 01/19/2020   CO2 25 01/19/2020   BUN 10 01/19/2020   CREATININE 0.96 01/19/2020   GLUCOSE 113 (H) 01/19/2020   No results found for: INR, PROTIME  Assessment/Plan: L3-4 and L4-5 spinal stenosis, lumbago, lumbar radiculopathy, neurogenic claudication: I have discussed the situation with the patient.  I reviewed his imaging studies with him and pointed out the abnormalities.  We have discussed the various treatment options including surgery.  I have described the surgical treatment option of an L3-4 and L4-5 laminectomy/laminotomy/foraminotomy.  I have shown him surgical models.  I have given him a surgical pamphlet.  We have discussed the risks, benefits, alternatives, expected postop course, and likelihood of achieving our goals with surgery.  I have answered all his questions.  He has decided proceed with surgery.   Ophelia Charter 01/21/2020 7:23 AM

## 2020-01-21 NOTE — Discharge Summary (Signed)
Physician Discharge Summary     Providing Compassionate, Quality Care - Together   Patient ID: Christopher Dudley MRN: [id number] DOB/AGE: [birth year] 85 y.o.  Admit date: 01/21/2020 Discharge date: 01/21/2020  Admission Diagnoses: L3-4 and L4-5 spinal stenosis, lumbago, lumbar radiculopathy, neurogenic claudication  Discharge Diagnoses:  Active Problems:   * No active hospital problems. *   Discharged Condition: good  Hospital Course: Patient underwent an L3-4 and L4-5 laminotomy/foraminotomy by Dr. Arnoldo Morale on 01/21/2020. He was recovered in the PACU, where he ambulated with the nurse. He is ambulating independently and without difficulty. He is tolerating a normal diet. He is not having any bowel or bladder dysfunction. His pain is well-controlled with oral pain medication. He reports he has some pain medication at home already. A muscle relaxant has been sent to his pharmacy on file. He is ready for discharge to home.   Consults: None  Significant Diagnostic Studies: radiology: DG Lumbar Spine 1 View  Result Date: 01/21/2020 CLINICAL DATA:  L3-5 laminectomy EXAM: LUMBAR SPINE - 1 VIEW COMPARISON:  MRI lumbar spine dated 12/24/2019 FINDINGS: Single lateral view of the lumbar spine demonstrates a needle at the L3-4 level, posterior to the superior endplate of the L4 vertebral body. IMPRESSION: Lumbar localization as above. Electronically Signed   By: Julian Hy M.D.   On: 01/21/2020 10:05     Treatments: surgery: Bilateral L3-4 and L4-5 laminotomy/foraminotomy using microdissection   Discharge Exam: Blood pressure (!) 147/94, pulse 96, temperature 97.6 F (36.4 C), resp. rate 19, height 5\' 6"  (1.676 m), weight 91.6 kg, SpO2 96 %.   Alert and oriented x 4 PERRLA CN II-XII grossly intact MAE, Strength and sensation intact Incision is covered with Honeycomb dressing and Steri Strips; Dressing is clean, dry, and intact   Disposition: Discharge disposition: 01-Home or Self  Care        Allergies as of 01/21/2020   No Known Allergies     Medication List    TAKE these medications   acetaminophen 500 MG tablet Commonly known as: TYLENOL Take 500-1,000 mg by mouth 2 (two) times daily as needed (back pain.).   cyclobenzaprine 10 MG tablet Commonly known as: FLEXERIL Take 1 tablet (10 mg total) by mouth 3 (three) times daily as needed for muscle spasms.   Edarbyclor 40-12.5 MG Tabs Generic drug: Azilsartan-Chlorthalidone Take 1 tablet by mouth daily.   multivitamin with minerals Tabs tablet Take 1 tablet by mouth daily.   VITAMIN D3 PO Take 1 tablet by mouth daily.       Follow-up Information    Newman Pies, MD. Schedule an appointment as soon as possible for a visit in 2 week(s).   Specialty: Neurosurgery Contact information: [address] [place] [address] [phone]               Signed: Patricia Nettle 01/21/2020, 1:56 PM

## 2020-01-21 NOTE — Op Note (Signed)
Brief history: The patient is an 85 year old white male who has complained of back and leg pain consistent with a lumbar radiculopathy.  He has failed medical management and was worked up with a lumbar MRI which demonstrated spinal stenosis most prominent at L3-4 and L4-5.  I discussed the various treatment options with him.  He has decided to proceed with surgery.  Preoperative diagnosis: L3-4 and L4-5 spinal stenosis, lumbago, lumbar radiculopathy, neurogenic claudication  Postoperative diagnosis: The same  Procedure: Bilateral L3-4 and L4-5 laminotomy/foraminotomy using microdissection   Surgeon: Dr. Earle Gell  Asst.: Arnetha Massy, NP  Anesthesia: Gen. endotracheal  Estimated blood loss: 100 cc  Drains: None  Complications: None  Description of procedure: The patient was brought to the operating room by the anesthesia team. General endotracheal anesthesia was induced. The patient was turned to the prone position on the Wilson frame. The patient's lumbosacral region was then prepared with Betadine scrub and Betadine solution. Sterile drapes were applied.  I then injected the area to be incised with Marcaine with epinephrine solution. I then used a scalpel to make a linear midline incision over the L3-4 and L4-5 intervertebral disc space. I then used electrocautery to perform a bilateral subperiosteal dissection exposing the spinous process and lamina of L3, L4 and L5. We obtained intraoperative radiograph to confirm our location. I then inserted the [facility] retractor for exposure.  We then brought the operative microscope into the field. Under its magnification and illumination we completed the microdissection. I used a high-speed drill to perform a laminotomy at L3-4 and L4-5 bilaterally. I then used a Kerrison punches to widen the laminotomy and removed the ligamentum flavum at L3-4 and L4-5 bilaterally.I then used a Kerrison punch to perform a foraminotomy at about the bilateral  L4 and L5 nerve root.  We inspected the intervertebral disc at L3-4 and L4-5 bilaterally.  There were no significant herniations.  I then palpated along the ventral surface of the thecal sac and along exit route of the L4 and L5 nerve root and noted that the neural structures were well decompressed. This completed the decompression.  We then obtained hemostasis using bipolar electrocautery. We irrigated the wound out with bacitracin solution. We then removed the retractor.  We injected Exparel into the wound.  We then reapproximated the patient's thoracolumbar fascia with interrupted #1 Vicryl suture. We then reapproximated the patient's subcutaneous tissue with interrupted 2-0 Vicryl suture. We then reapproximated patient's skin with Steri-Strips and benzoin. The was then coated with bacitracin ointment. The drapes were removed. The patient was subsequently returned to the supine position where they were extubated by the anesthesia team. The patient was then transported to the postanesthesia care unit in stable condition. All sponge instrument and needle counts were reportedly correct at the end of this case.

## 2020-01-21 NOTE — Addendum Note (Signed)
Addended by: Karle Barr on: 01/21/2020 03:49 PM   Modules accepted: Orders

## 2020-01-21 NOTE — Transfer of Care (Signed)
Immediate Anesthesia Transfer of Care Note  Patient: Christopher Dudley  Procedure(s) Performed: LAMINECTOMY AND FORAMINOTOMY BILATERAL, LUMBAR THREE- LUMBAR FOUR, LUMBAR FOUR- LUMBAR FIVE (Bilateral )  Patient Location: PACU  Anesthesia Type:General  Level of Consciousness: awake, alert , oriented and patient cooperative  Airway & Oxygen Therapy: Patient Spontanous Breathing and Patient connected to nasal cannula oxygen  Post-op Assessment: Report given to RN and Post -op Vital signs reviewed and stable  Post vital signs: Reviewed and stable  Last Vitals:  Vitals Value Taken Time  BP 153/86 01/21/20 1001  Temp    Pulse 87 01/21/20 1002  Resp 38 01/21/20 1002  SpO2 99 % 01/21/20 1002  Vitals shown include unvalidated device data.  Last Pain:  Vitals:   01/21/20 0621  TempSrc:   PainSc: 3       Patients Stated Pain Goal: 2 (41/28/78 6767)  Complications: No complications documented.

## 2020-01-22 ENCOUNTER — Encounter (HOSPITAL_COMMUNITY): Payer: Self-pay | Admitting: Neurosurgery

## 2020-02-24 ENCOUNTER — Encounter: Payer: Self-pay | Admitting: Internal Medicine

## 2020-02-24 ENCOUNTER — Ambulatory Visit (INDEPENDENT_AMBULATORY_CARE_PROVIDER_SITE_OTHER): Payer: PPO | Admitting: Internal Medicine

## 2020-02-24 ENCOUNTER — Other Ambulatory Visit: Payer: Self-pay

## 2020-02-24 VITALS — BP 126/84 | HR 84 | Temp 98.3°F | Resp 16 | Ht 66.0 in | Wt 195.0 lb

## 2020-02-24 DIAGNOSIS — E785 Hyperlipidemia, unspecified: Secondary | ICD-10-CM | POA: Diagnosis not present

## 2020-02-24 DIAGNOSIS — Z23 Encounter for immunization: Secondary | ICD-10-CM | POA: Diagnosis not present

## 2020-02-24 DIAGNOSIS — I700 Atherosclerosis of aorta: Secondary | ICD-10-CM | POA: Diagnosis not present

## 2020-02-24 DIAGNOSIS — I10 Essential (primary) hypertension: Secondary | ICD-10-CM | POA: Diagnosis not present

## 2020-02-24 MED ORDER — IRBESARTAN 150 MG PO TABS: 150.0000 mg | ORAL_TABLET | Freq: Every day | ORAL | 1 refills | Status: DC

## 2020-02-24 MED ORDER — ROSUVASTATIN CALCIUM 10 MG PO TABS: 10.0000 mg | ORAL_TABLET | Freq: Every day | ORAL | 1 refills | Status: DC

## 2020-02-24 NOTE — Patient Instructions (Signed)

## 2020-02-24 NOTE — Progress Notes (Signed)
Subjective:  Patient ID: Christopher Dudley, male    DOB: [birth year]  Age: 85 y.o. MRN: [id number]  CC: Hypertension  This visit occurred during the SARS-CoV-2 public health emergency.  Safety protocols were in place, including screening questions prior to the visit, additional usage of staff PPE, and extensive cleaning of exam room while observing appropriate contact time as indicated for disinfecting solutions.    HPI Christopher Dudley presents for f/up - He has been taking the ARB/thiazide combination since I last saw him.  He complains it is taken his blood pressure too low and it is too expensive.  He has had a few episodes of dizziness and lightheadedness.  He is also started exercising and denies any recent episodes of CP, DOE, palpitations, edema, or fatigue.  Outpatient Medications Prior to Visit  Medication Sig Dispense Refill  . acetaminophen (TYLENOL) 500 MG tablet Take 500-1,000 mg by mouth 2 (two) times daily as needed (back pain.).     Marland Kitchen Cholecalciferol (VITAMIN D3 PO) Take 1 tablet by mouth daily.     . cyclobenzaprine (FLEXERIL) 10 MG tablet Take 1 tablet (10 mg total) by mouth 3 (three) times daily as needed for muscle spasms. 30 tablet 0  . Multiple Vitamin (MULTIVITAMIN WITH MINERALS) TABS tablet Take 1 tablet by mouth daily.     . Azilsartan-Chlorthalidone (EDARBYCLOR) 40-12.5 MG TABS Take 1 tablet by mouth daily. 35 tablet 0   No facility-administered medications prior to visit.    ROS Review of Systems  Constitutional: Negative for appetite change, diaphoresis, fatigue and unexpected weight change.  HENT: Negative.   Eyes: Negative for visual disturbance.  Respiratory: Negative for cough, chest tightness, shortness of breath and wheezing.   Cardiovascular: Negative for chest pain, palpitations and leg swelling.  Gastrointestinal: Negative for abdominal pain, constipation, diarrhea, nausea and vomiting.  Endocrine: Negative.   Genitourinary: Negative.  Negative  for difficulty urinating.  Musculoskeletal: Negative for arthralgias.  Skin: Negative.  Negative for color change and pallor.  Neurological: Positive for dizziness and light-headedness. Negative for weakness.  Hematological: Negative for adenopathy. Does not bruise/bleed easily.  Psychiatric/Behavioral: Negative.     Objective:  BP 126/84   Pulse 84   Temp 98.3 F (36.8 C) (Oral)   Resp 16   Ht 5\' 6"  (1.676 m)   Wt 195 lb (88.5 kg)   SpO2 99%   BMI 31.47 kg/m   BP Readings from Last 3 Encounters:  02/24/20 126/84  01/21/20 (!) 147/94  01/20/20 (!) 182/96    Wt Readings from Last 3 Encounters:  02/24/20 195 lb (88.5 kg)  01/21/20 202 lb (91.6 kg)  01/20/20 202 lb (91.6 kg)    Physical Exam Vitals reviewed.  Constitutional:      Appearance: Normal appearance.  HENT:     Nose: Nose normal.     Mouth/Throat:     Mouth: Mucous membranes are moist.  Eyes:     General: No scleral icterus.    Conjunctiva/sclera: Conjunctivae normal.  Cardiovascular:     Rate and Rhythm: Normal rate and regular rhythm.     Heart sounds: No murmur heard.   Pulmonary:     Effort: Pulmonary effort is normal.     Breath sounds: No stridor. No wheezing, rhonchi or rales.  Abdominal:     General: Abdomen is protuberant. Bowel sounds are normal. There is no distension.     Palpations: Abdomen is soft. There is no hepatomegaly, splenomegaly or mass.  Tenderness: There is no abdominal tenderness.  Musculoskeletal:        General: Normal range of motion.     Cervical back: Neck supple.     Right lower leg: No edema.     Left lower leg: No edema.  Lymphadenopathy:     Cervical: No cervical adenopathy.  Skin:    General: Skin is warm and dry.     Coloration: Skin is not pale.  Neurological:     General: No focal deficit present.     Mental Status: He is alert.     Lab Results  Component Value Date   WBC 9.0 01/19/2020   HGB 15.4 01/19/2020   HCT 47.7 01/19/2020   PLT 252  01/19/2020   GLUCOSE 113 (H) 01/19/2020   CHOL 172 07/04/2017   TRIG 73 07/04/2017   HDL 50 07/04/2017   LDLCALC 115 07/17/2018   ALT 27 07/17/2018   AST 17 07/17/2018   NA 137 01/19/2020   K 4.9 01/19/2020   CL 100 01/19/2020   CREATININE 0.96 01/19/2020   BUN 10 01/19/2020   CO2 25 01/19/2020   TSH 1.06 01/20/2020   PSA 4.27 01/24/2019   MICROALBUR 0.598 07/04/2017    No results found.  Assessment & Plan:   Christopher Dudley was seen today for hypertension.  Diagnoses and all orders for this visit:  Atherosclerosis of aorta ([place])- Risk factor modifications addressed. -     rosuvastatin (CRESTOR) 10 MG tablet; Take 1 tablet (10 mg total) by mouth daily. -     irbesartan (AVAPRO) 150 MG tablet; Take 1 tablet (150 mg total) by mouth daily.  Hyperlipidemia with target LDL less than 130- He has an elevated ASCVD risk score so I have asked him to take a statin for CV risk reduction. -     rosuvastatin (CRESTOR) 10 MG tablet; Take 1 tablet (10 mg total) by mouth daily.  Essential hypertension- His blood pressure is overcontrolled on the combination of an ARB and thiazide diuretic.  I will decrease his treatment to monotherapy with an ARB. -     irbesartan (AVAPRO) 150 MG tablet; Take 1 tablet (150 mg total) by mouth daily.  Flu vaccine need -     Flu Vaccine QUAD High Dose(Fluad)  Other orders -     Tdap vaccine greater than or equal to 7yo IM   I have discontinued Emeline Gins. [place] "Christopher Dudley"'s Lexicographer. I am also having him start on rosuvastatin and irbesartan. Additionally, I am having him maintain his multivitamin with minerals, acetaminophen, Cholecalciferol (VITAMIN D3 PO), and cyclobenzaprine.  Meds ordered this encounter  Medications  . rosuvastatin (CRESTOR) 10 MG tablet    Sig: Take 1 tablet (10 mg total) by mouth daily.    Dispense:  90 tablet    Refill:  1  . irbesartan (AVAPRO) 150 MG tablet    Sig: Take 1 tablet (150 mg total) by mouth daily.    Dispense:  90  tablet    Refill:  1     Follow-up: Return in about 6 months (around 08/23/2020).  Scarlette Calico, MD

## 2020-06-28 DIAGNOSIS — Z85828 Personal history of other malignant neoplasm of skin: Secondary | ICD-10-CM | POA: Diagnosis not present

## 2020-06-28 DIAGNOSIS — L821 Other seborrheic keratosis: Secondary | ICD-10-CM | POA: Diagnosis not present

## 2020-06-28 DIAGNOSIS — L812 Freckles: Secondary | ICD-10-CM | POA: Diagnosis not present

## 2020-06-28 DIAGNOSIS — D225 Melanocytic nevi of trunk: Secondary | ICD-10-CM | POA: Diagnosis not present

## 2020-06-28 DIAGNOSIS — L570 Actinic keratosis: Secondary | ICD-10-CM | POA: Diagnosis not present

## 2020-08-04 DIAGNOSIS — H524 Presbyopia: Secondary | ICD-10-CM | POA: Diagnosis not present

## 2020-08-04 DIAGNOSIS — H11002 Unspecified pterygium of left eye: Secondary | ICD-10-CM | POA: Diagnosis not present

## 2020-08-04 DIAGNOSIS — H2513 Age-related nuclear cataract, bilateral: Secondary | ICD-10-CM | POA: Diagnosis not present

## 2020-08-04 DIAGNOSIS — H5203 Hypermetropia, bilateral: Secondary | ICD-10-CM | POA: Diagnosis not present

## 2020-08-04 DIAGNOSIS — H52203 Unspecified astigmatism, bilateral: Secondary | ICD-10-CM | POA: Diagnosis not present

## 2020-08-12 ENCOUNTER — Other Ambulatory Visit: Payer: Self-pay | Admitting: Internal Medicine

## 2020-08-12 DIAGNOSIS — E785 Hyperlipidemia, unspecified: Secondary | ICD-10-CM

## 2020-08-12 DIAGNOSIS — I700 Atherosclerosis of aorta: Secondary | ICD-10-CM

## 2020-08-12 DIAGNOSIS — I10 Essential (primary) hypertension: Secondary | ICD-10-CM

## 2020-08-20 LAB — BASIC METABOLIC PANEL
BUN: 13 (ref 4–21)
CO2: 28 — AB (ref 13–22)
Chloride: 108 (ref 99–108)
Creatinine: 1 (ref 0.6–1.3)
Glucose: 102
Potassium: 4.5 (ref 3.4–5.3)
Sodium: 138 (ref 137–147)

## 2020-08-20 LAB — CBC AND DIFFERENTIAL
HCT: 43 (ref 41–53)
Hemoglobin: 14.4 (ref 13.5–17.5)
Platelets: 212 (ref 150–399)
WBC: 6.6

## 2020-08-20 LAB — LIPID PANEL
Cholesterol: 124 (ref 0–200)
HDL: 52 (ref 35–70)
LDL Cholesterol: 61
Triglycerides: 56 (ref 40–160)

## 2020-08-20 LAB — COMPREHENSIVE METABOLIC PANEL: Calcium: 8.6 — AB (ref 8.7–10.7)

## 2020-08-23 ENCOUNTER — Encounter: Payer: Self-pay | Admitting: Internal Medicine

## 2020-08-23 ENCOUNTER — Ambulatory Visit (INDEPENDENT_AMBULATORY_CARE_PROVIDER_SITE_OTHER): Payer: PPO | Admitting: Internal Medicine

## 2020-08-23 ENCOUNTER — Other Ambulatory Visit: Payer: Self-pay

## 2020-08-23 VITALS — BP 136/68 | HR 85 | Temp 98.0°F | Resp 16 | Wt 189.8 lb

## 2020-08-23 DIAGNOSIS — E785 Hyperlipidemia, unspecified: Secondary | ICD-10-CM | POA: Diagnosis not present

## 2020-08-23 DIAGNOSIS — I700 Atherosclerosis of aorta: Secondary | ICD-10-CM

## 2020-08-23 DIAGNOSIS — Z0000 Encounter for general adult medical examination without abnormal findings: Secondary | ICD-10-CM | POA: Diagnosis not present

## 2020-08-23 DIAGNOSIS — I10 Essential (primary) hypertension: Secondary | ICD-10-CM

## 2020-08-23 MED ORDER — ROSUVASTATIN CALCIUM 10 MG PO TABS
10.0000 mg | ORAL_TABLET | Freq: Every day | ORAL | 1 refills | Status: DC
Start: 2020-08-23 — End: 2021-05-15

## 2020-08-23 MED ORDER — IRBESARTAN 150 MG PO TABS
150.0000 mg | ORAL_TABLET | Freq: Every day | ORAL | 1 refills | Status: DC
Start: 2020-08-23 — End: 2021-05-15

## 2020-08-23 NOTE — Progress Notes (Addendum)
Subjective:  Patient ID: Christopher Dudley, male    DOB: 12/25/1934  Age: 86 y.o. MRN: [id number]  CC: Annual Exam and Hyperlipidemia  This visit occurred during the SARS-CoV-2 public health emergency.  Safety protocols were in place, including screening questions prior to the visit, additional usage of staff PPE, and extensive cleaning of exam room while observing appropriate contact time as indicated for disinfecting solutions.    HPI Christopher Dudley presents for a CPX.  He is very active.  He walks several miles a day and works out at [employer].  He denies CP, DOE, palpitations, or edema.  He tells me his blood pressure is well controlled.  He does not have episodes of dizziness or lightheadedness.  He tells me he had lab work done last week at the [place] but none of those results are available to me today.   Outpatient Medications Prior to Visit  Medication Sig Dispense Refill  . Cholecalciferol (VITAMIN D3 PO) Take 1 tablet by mouth daily.     . Multiple Vitamin (MULTIVITAMIN WITH MINERALS) TABS tablet Take 1 tablet by mouth daily.     . irbesartan (AVAPRO) 150 MG tablet TAKE 1 TABLET BY MOUTH EVERY DAY 90 tablet 1  . rosuvastatin (CRESTOR) 10 MG tablet TAKE 1 TABLET BY MOUTH EVERY DAY 90 tablet 1  . acetaminophen (TYLENOL) 500 MG tablet Take 500-1,000 mg by mouth 2 (two) times daily as needed (back pain.).  (Patient not taking: Reported on 08/23/2020)    . cyclobenzaprine (FLEXERIL) 10 MG tablet Take 1 tablet (10 mg total) by mouth 3 (three) times daily as needed for muscle spasms. (Patient not taking: Reported on 08/23/2020) 30 tablet 0   No facility-administered medications prior to visit.    ROS Review of Systems  Constitutional: Negative for chills, diaphoresis, fatigue and fever.  HENT: Negative.   Eyes: Negative.   Respiratory: Negative for cough, chest tightness, shortness of breath and wheezing.   Cardiovascular: Negative for chest pain, palpitations and leg swelling.   Gastrointestinal: Negative for abdominal pain, constipation, diarrhea, nausea and vomiting.  Endocrine: Negative.   Genitourinary: Negative.  Negative for difficulty urinating, penile swelling, scrotal swelling and testicular pain.  Musculoskeletal: Negative for arthralgias and myalgias.  Skin: Negative.  Negative for color change.    Objective:  BP 136/68   Pulse 85   Temp 98 F (36.7 C) (Oral)   Resp 16   Wt 189 lb 12.8 oz (86.1 kg)   HC 16" (40.6 cm)   SpO2 98%   BMI 30.63 kg/m   BP Readings from Last 3 Encounters:  08/23/20 136/68  02/24/20 126/84  01/21/20 (!) 147/94    Wt Readings from Last 3 Encounters:  08/23/20 189 lb 12.8 oz (86.1 kg)  02/24/20 195 lb (88.5 kg)  01/21/20 202 lb (91.6 kg)    Physical Exam Vitals reviewed.  Constitutional:      Appearance: Normal appearance.  HENT:     Nose: Nose normal.     Mouth/Throat:     Mouth: Mucous membranes are moist.  Eyes:     General: No scleral icterus.    Conjunctiva/sclera: Conjunctivae normal.  Cardiovascular:     Rate and Rhythm: Normal rate and regular rhythm.     Heart sounds: No murmur heard.   Pulmonary:     Effort: Pulmonary effort is normal.     Breath sounds: No stridor. No wheezing, rhonchi or rales.  Abdominal:     General: Abdomen is  flat. Bowel sounds are normal. There is no distension.     Palpations: Abdomen is soft.     Tenderness: There is no abdominal tenderness.  Musculoskeletal:        General: Normal range of motion.     Cervical back: Neck supple.  Lymphadenopathy:     Cervical: No cervical adenopathy.  Skin:    General: Skin is warm and dry.  Neurological:     General: No focal deficit present.     Mental Status: He is alert.  Psychiatric:        Mood and Affect: Mood normal.        Behavior: Behavior normal.     Lab Results  Component Value Date   WBC 6.6 08/20/2020   HGB 14.4 08/20/2020   HCT 43 08/20/2020   PLT 212 08/20/2020   GLUCOSE 113 (H) 01/19/2020    CHOL 124 08/20/2020   TRIG 56 08/20/2020   HDL 52 08/20/2020   LDLCALC 61 08/20/2020   ALT 27 07/17/2018   AST 17 07/17/2018   NA 138 08/20/2020   K 4.5 08/20/2020   CL 108 08/20/2020   CREATININE 1.0 08/20/2020   BUN 13 08/20/2020   CO2 28 (A) 08/20/2020   TSH 1.06 01/20/2020   PSA 4.27 01/24/2019   MICROALBUR 0.598 07/04/2017    No results found.  Assessment & Plan:   Christopher Dudley was seen today for annual exam and hyperlipidemia.  Diagnoses and all orders for this visit:  Atherosclerosis of aorta ([place])- Risk factor modifications have been addressed. -     Lipid panel; Future -     rosuvastatin (CRESTOR) 10 MG tablet; Take 1 tablet (10 mg total) by mouth daily. -     irbesartan (AVAPRO) 150 MG tablet; Take 1 tablet (150 mg total) by mouth daily.  Essential hypertension- His blood pressure is well controlled.  I will monitor his lites and renal function. -     CBC with Differential/Platelet; Future -     Basic metabolic panel; Future -     irbesartan (AVAPRO) 150 MG tablet; Take 1 tablet (150 mg total) by mouth daily.  Hyperlipidemia with target LDL less than 130- Will check an LDL to see if he has achieved his goal.  He is doing well on the statin. -     Lipid panel; Future -     TSH; Future -     Hepatic function panel; Future -     rosuvastatin (CRESTOR) 10 MG tablet; Take 1 tablet (10 mg total) by mouth daily.  Routine health maintenance- Exam completed, labs reviewed, vaccines reviewed and updated, no cancer screenings are indicated, patient education was given.   I have discontinued Christopher Dudley. Christopher Dudley "Christopher Dudley"'s acetaminophen and cyclobenzaprine. I have also changed his rosuvastatin and irbesartan. Additionally, I am having him maintain his multivitamin with minerals and Cholecalciferol (VITAMIN D3 PO).  Meds ordered this encounter  Medications  . rosuvastatin (CRESTOR) 10 MG tablet    Sig: Take 1 tablet (10 mg total) by mouth daily.    Dispense:  90 tablet     Refill:  1  . irbesartan (AVAPRO) 150 MG tablet    Sig: Take 1 tablet (150 mg total) by mouth daily.    Dispense:  90 tablet    Refill:  1     Follow-up: Return in about 6 months (around 02/23/2021).  Scarlette Calico, MD

## 2020-08-23 NOTE — Patient Instructions (Signed)

## 2020-08-25 ENCOUNTER — Telehealth: Payer: Self-pay | Admitting: Internal Medicine

## 2020-08-25 NOTE — Telephone Encounter (Signed)
Patient dropped his lab results. Put in the providers box.

## 2021-02-01 ENCOUNTER — Other Ambulatory Visit: Payer: Self-pay

## 2021-02-01 ENCOUNTER — Ambulatory Visit (INDEPENDENT_AMBULATORY_CARE_PROVIDER_SITE_OTHER): Payer: PPO

## 2021-02-01 DIAGNOSIS — Z23 Encounter for immunization: Secondary | ICD-10-CM

## 2021-05-15 ENCOUNTER — Other Ambulatory Visit: Payer: Self-pay | Admitting: Internal Medicine

## 2021-05-15 DIAGNOSIS — I10 Essential (primary) hypertension: Secondary | ICD-10-CM

## 2021-05-15 DIAGNOSIS — E785 Hyperlipidemia, unspecified: Secondary | ICD-10-CM

## 2021-05-15 DIAGNOSIS — I700 Atherosclerosis of aorta: Secondary | ICD-10-CM

## 2021-06-28 DIAGNOSIS — L821 Other seborrheic keratosis: Secondary | ICD-10-CM | POA: Diagnosis not present

## 2021-06-28 DIAGNOSIS — D485 Neoplasm of uncertain behavior of skin: Secondary | ICD-10-CM | POA: Diagnosis not present

## 2021-06-28 DIAGNOSIS — D225 Melanocytic nevi of trunk: Secondary | ICD-10-CM | POA: Diagnosis not present

## 2021-06-28 DIAGNOSIS — D2272 Melanocytic nevi of left lower limb, including hip: Secondary | ICD-10-CM | POA: Diagnosis not present

## 2021-06-28 DIAGNOSIS — L812 Freckles: Secondary | ICD-10-CM | POA: Diagnosis not present

## 2021-06-28 DIAGNOSIS — L570 Actinic keratosis: Secondary | ICD-10-CM | POA: Diagnosis not present

## 2021-06-28 DIAGNOSIS — L84 Corns and callosities: Secondary | ICD-10-CM | POA: Diagnosis not present

## 2021-06-28 DIAGNOSIS — D0462 Carcinoma in situ of skin of left upper limb, including shoulder: Secondary | ICD-10-CM | POA: Diagnosis not present

## 2021-06-28 DIAGNOSIS — Z85828 Personal history of other malignant neoplasm of skin: Secondary | ICD-10-CM | POA: Diagnosis not present

## 2021-06-28 DIAGNOSIS — L720 Epidermal cyst: Secondary | ICD-10-CM | POA: Diagnosis not present

## 2021-08-08 DIAGNOSIS — H2513 Age-related nuclear cataract, bilateral: Secondary | ICD-10-CM | POA: Diagnosis not present

## 2021-08-08 DIAGNOSIS — H11002 Unspecified pterygium of left eye: Secondary | ICD-10-CM | POA: Diagnosis not present

## 2021-08-08 DIAGNOSIS — H52203 Unspecified astigmatism, bilateral: Secondary | ICD-10-CM | POA: Diagnosis not present

## 2021-08-08 DIAGNOSIS — H5203 Hypermetropia, bilateral: Secondary | ICD-10-CM | POA: Diagnosis not present

## 2021-08-08 DIAGNOSIS — H524 Presbyopia: Secondary | ICD-10-CM | POA: Diagnosis not present

## 2021-08-08 DIAGNOSIS — H25013 Cortical age-related cataract, bilateral: Secondary | ICD-10-CM | POA: Diagnosis not present

## 2021-11-10 ENCOUNTER — Other Ambulatory Visit: Payer: Self-pay | Admitting: Internal Medicine

## 2021-11-10 DIAGNOSIS — I10 Essential (primary) hypertension: Secondary | ICD-10-CM

## 2021-11-10 DIAGNOSIS — I700 Atherosclerosis of aorta: Secondary | ICD-10-CM

## 2021-11-10 DIAGNOSIS — E785 Hyperlipidemia, unspecified: Secondary | ICD-10-CM

## 2021-11-16 ENCOUNTER — Other Ambulatory Visit: Payer: Self-pay | Admitting: Internal Medicine

## 2021-11-16 ENCOUNTER — Telehealth: Payer: Self-pay | Admitting: Internal Medicine

## 2021-11-16 DIAGNOSIS — I10 Essential (primary) hypertension: Secondary | ICD-10-CM

## 2021-11-16 DIAGNOSIS — E785 Hyperlipidemia, unspecified: Secondary | ICD-10-CM

## 2021-11-16 DIAGNOSIS — I700 Atherosclerosis of aorta: Secondary | ICD-10-CM

## 2021-11-16 MED ORDER — IRBESARTAN 150 MG PO TABS: 150.0000 mg | ORAL_TABLET | Freq: Every day | ORAL | 0 refills | Status: DC

## 2021-11-16 MED ORDER — ROSUVASTATIN CALCIUM 10 MG PO TABS: 10.0000 mg | ORAL_TABLET | Freq: Every day | ORAL | 0 refills | Status: DC

## 2021-11-16 NOTE — Telephone Encounter (Signed)
Caller & Relationship to patient: Christopher Dudley  Call back number: [phone]  Date of last office visit: 08/23/20  Date of next office visit: 12/12/21  Medication(s) to be refilled:  irbesartan (AVAPRO) 150 MG tablet rosuvastatin (CRESTOR) 10 MG tablet      [facility]erred Pharmacy:  [id number] [place] [address]:  [phone] Fax:  [phone]

## 2021-12-12 ENCOUNTER — Encounter: Payer: Self-pay | Admitting: Internal Medicine

## 2021-12-12 ENCOUNTER — Ambulatory Visit (INDEPENDENT_AMBULATORY_CARE_PROVIDER_SITE_OTHER): Payer: PPO | Admitting: Internal Medicine

## 2021-12-12 VITALS — BP 128/68 | HR 70 | Temp 98.3°F | Ht 66.0 in | Wt 196.0 lb

## 2021-12-12 DIAGNOSIS — Z23 Encounter for immunization: Secondary | ICD-10-CM

## 2021-12-12 DIAGNOSIS — I700 Atherosclerosis of aorta: Secondary | ICD-10-CM | POA: Diagnosis not present

## 2021-12-12 DIAGNOSIS — E785 Hyperlipidemia, unspecified: Secondary | ICD-10-CM | POA: Diagnosis not present

## 2021-12-12 DIAGNOSIS — Z0000 Encounter for general adult medical examination without abnormal findings: Secondary | ICD-10-CM | POA: Diagnosis not present

## 2021-12-12 DIAGNOSIS — I10 Essential (primary) hypertension: Secondary | ICD-10-CM | POA: Diagnosis not present

## 2021-12-12 MED ORDER — ROSUVASTATIN CALCIUM 10 MG PO TABS: 10.0000 mg | ORAL_TABLET | Freq: Every day | ORAL | 1 refills | Status: DC

## 2021-12-12 MED ORDER — IRBESARTAN 150 MG PO TABS: 150.0000 mg | ORAL_TABLET | Freq: Every day | ORAL | 1 refills | Status: DC

## 2021-12-12 NOTE — Progress Notes (Signed)
Subjective:  Patient ID: Christopher Dudley, male    DOB: [birth year]  Age: 87 y.o. MRN: [id number]  CC: Annual Exam, Hypertension, and Hyperlipidemia   HPI Christopher Dudley presents for a CPX and f/up -    He walks about 3-1/2 miles a day and has good endurance.  He denies chest pain, shortness of breath, diaphoresis, edema, or fatigue.  Outpatient Medications Prior to Visit  Medication Sig Dispense Refill   Multiple Vitamin (MULTIVITAMIN WITH MINERALS) TABS tablet Take 1 tablet by mouth daily.      irbesartan (AVAPRO) 150 MG tablet Take 1 tablet (150 mg total) by mouth daily. 30 tablet 0   rosuvastatin (CRESTOR) 10 MG tablet Take 1 tablet (10 mg total) by mouth daily. 30 tablet 0   Cholecalciferol (VITAMIN D3 PO) Take 1 tablet by mouth daily.      No facility-administered medications prior to visit.    ROS Review of Systems  Constitutional: Negative.  Negative for diaphoresis and fatigue.  HENT: Negative.    Eyes: Negative.   Respiratory:  Negative for cough, chest tightness and wheezing.   Cardiovascular:  Negative for chest pain, palpitations and leg swelling.  Gastrointestinal:  Negative for abdominal pain, constipation, diarrhea, nausea and vomiting.  Endocrine: Negative.   Genitourinary: Negative.  Negative for difficulty urinating, dysuria, hematuria and testicular pain.  Musculoskeletal:  Negative for arthralgias and myalgias.  Skin: Negative.  Negative for color change and pallor.  Neurological: Negative.  Negative for dizziness, weakness and light-headedness.  Hematological:  Negative for adenopathy. Does not bruise/bleed easily.  Psychiatric/Behavioral: Negative.      Objective:  BP 128/68 (BP Location: Left Arm, Patient Position: Sitting, Cuff Size: Large)   Pulse 70   Temp 98.3 F (36.8 C) (Oral)   Ht '5\' 6"'$  (1.676 m)   Wt 196 lb (88.9 kg)   SpO2 97%   BMI 31.64 kg/m   BP Readings from Last 3 Encounters:  12/12/21 128/68  08/23/20 136/68  02/24/20  126/84    Wt Readings from Last 3 Encounters:  12/12/21 196 lb (88.9 kg)  08/23/20 189 lb 12.8 oz (86.1 kg)  02/24/20 195 lb (88.5 kg)    Physical Exam Vitals reviewed.  HENT:     Nose: Nose normal.     Mouth/Throat:     Mouth: Mucous membranes are moist.  Eyes:     General: No scleral icterus.    Conjunctiva/sclera: Conjunctivae normal.  Cardiovascular:     Rate and Rhythm: Normal rate and regular rhythm.     Heart sounds: No murmur heard. Pulmonary:     Effort: Pulmonary effort is normal.     Breath sounds: No stridor. No wheezing, rhonchi or rales.  Abdominal:     General: Abdomen is flat.     Palpations: There is no mass.     Tenderness: There is no abdominal tenderness. There is no guarding or rebound.     Hernia: No hernia is present.  Musculoskeletal:        General: Normal range of motion.     Cervical back: Neck supple.     Right lower leg: No edema.     Left lower leg: No edema.  Lymphadenopathy:     Cervical: No cervical adenopathy.  Skin:    General: Skin is warm and dry.  Neurological:     General: No focal deficit present.     Mental Status: He is alert.  Psychiatric:  Mood and Affect: Mood normal.        Behavior: Behavior normal.     Lab Results  Component Value Date   WBC 6.6 08/20/2020   HGB 14.4 08/20/2020   HCT 43 08/20/2020   PLT 212 08/20/2020   GLUCOSE 113 (H) 01/19/2020   CHOL 124 08/20/2020   TRIG 56 08/20/2020   HDL 52 08/20/2020   LDLCALC 61 08/20/2020   ALT 27 07/17/2018   AST 17 07/17/2018   NA 138 08/20/2020   K 4.5 08/20/2020   CL 108 08/20/2020   CREATININE 1.0 08/20/2020   BUN 13 08/20/2020   CO2 28 (A) 08/20/2020   TSH 1.06 01/20/2020   PSA 4.27 01/24/2019   MICROALBUR 0.598 07/04/2017    DG Lumbar Spine 1 View  Result Date: 01/21/2020 CLINICAL DATA:  L3-5 laminectomy EXAM: LUMBAR SPINE - 1 VIEW COMPARISON:  MRI lumbar spine dated 12/24/2019 FINDINGS: Single lateral view of the lumbar spine demonstrates a  needle at the L3-4 level, posterior to the superior endplate of the L4 vertebral body. IMPRESSION: Lumbar localization as above. Electronically Signed   By: Julian Hy M.D.   On: 01/21/2020 10:05    Assessment & Plan:   Sevin was seen today for annual exam, hypertension and hyperlipidemia.  Diagnoses and all orders for this visit:  Essential hypertension- His blood pressure is adequately well controlled. -     Basic metabolic panel; Future -     Urinalysis, Routine w reflex microscopic; Future -     TSH; Future -     Hepatic function panel; Future -     CBC with Differential/Platelet; Future -     irbesartan (AVAPRO) 150 MG tablet; Take 1 tablet (150 mg total) by mouth daily.  Atherosclerosis of aorta ([place])- Risk factor modifications addressed. -     Lipid panel; Future -     rosuvastatin (CRESTOR) 10 MG tablet; Take 1 tablet (10 mg total) by mouth daily. -     irbesartan (AVAPRO) 150 MG tablet; Take 1 tablet (150 mg total) by mouth daily.  Routine health maintenance- Exam completed, labs reviewed, vaccines reviewed and updated, no cancer screenings indicated, patient education was given.  Hyperlipidemia with target LDL less than 130- Will monitor his fasting lipid panel. -     Lipid panel; Future -     TSH; Future -     Hepatic function panel; Future -     rosuvastatin (CRESTOR) 10 MG tablet; Take 1 tablet (10 mg total) by mouth daily.  Need for prophylactic vaccination and inoculation against varicella -     Zoster Vaccine Adjuvanted [facility]) injection; Inject 0.5 mLs into the muscle once for 1 dose.  Other orders -     Pneumococcal polysaccharide vaccine 23-valent greater than or equal to 2yo subcutaneous/IM   I have discontinued Emeline Gins. Poch "[name]"'s Cholecalciferol (VITAMIN D3 PO). I am also having him start on Shingrix. Additionally, I am having him maintain his multivitamin with minerals, rosuvastatin, and irbesartan.  Meds ordered this encounter   Medications   rosuvastatin (CRESTOR) 10 MG tablet    Sig: Take 1 tablet (10 mg total) by mouth daily.    Dispense:  90 tablet    Refill:  1   irbesartan (AVAPRO) 150 MG tablet    Sig: Take 1 tablet (150 mg total) by mouth daily.    Dispense:  90 tablet    Refill:  1   Zoster Vaccine Adjuvanted [facility]) injection  Sig: Inject 0.5 mLs into the muscle once for 1 dose.    Dispense:  0.5 mL    Refill:  1     Follow-up: Return in about 6 months (around 06/14/2022).  Scarlette Calico, MD

## 2021-12-12 NOTE — Patient Instructions (Signed)
Health Maintenance, Male Adopting a healthy lifestyle and getting preventive care are important in promoting health and wellness. Ask your health care provider about: The right schedule for you to have regular tests and exams. Things you can do on your own to prevent diseases and keep yourself healthy. What should I know about diet, weight, and exercise? Eat a healthy diet  Eat a diet that includes plenty of vegetables, fruits, low-fat dairy products, and lean protein. Do not eat a lot of foods that are high in solid fats, added sugars, or sodium. Maintain a healthy weight Body mass index (BMI) is a measurement that can be used to identify possible weight problems. It estimates body fat based on height and weight. Your health care provider can help determine your BMI and help you achieve or maintain a healthy weight. Get regular exercise Get regular exercise. This is one of the most important things you can do for your health. Most adults should: Exercise for at least 150 minutes each week. The exercise should increase your heart rate and make you sweat (moderate-intensity exercise). Do strengthening exercises at least twice a week. This is in addition to the moderate-intensity exercise. Spend less time sitting. Even light physical activity can be beneficial. Watch cholesterol and blood lipids Have your blood tested for lipids and cholesterol at 85 years of age, then have this test every 5 years. You may need to have your cholesterol levels checked more often if: Your lipid or cholesterol levels are high. You are older than 85 years of age. You are at high risk for heart disease. What should I know about cancer screening? Many types of cancers can be detected early and may often be prevented. Depending on your health history and family history, you may need to have cancer screening at various ages. This may include screening for: Colorectal cancer. Prostate cancer. Skin cancer. Lung  cancer. What should I know about heart disease, diabetes, and high blood pressure? Blood pressure and heart disease High blood pressure causes heart disease and increases the risk of stroke. This is more likely to develop in people who have high blood pressure readings or are overweight. Talk with your health care provider about your target blood pressure readings. Have your blood pressure checked: Every 3-5 years if you are 18-39 years of age. Every year if you are 40 years old or older. If you are between the ages of 65 and 75 and are a current or former smoker, ask your health care provider if you should have a one-time screening for abdominal aortic aneurysm (AAA). Diabetes Have regular diabetes screenings. This checks your fasting blood sugar level. Have the screening done: Once every three years after age 45 if you are at a normal weight and have a low risk for diabetes. More often and at a younger age if you are overweight or have a high risk for diabetes. What should I know about preventing infection? Hepatitis B If you have a higher risk for hepatitis B, you should be screened for this virus. Talk with your health care provider to find out if you are at risk for hepatitis B infection. Hepatitis C Blood testing is recommended for: Everyone born from 1945 through 1965. Anyone with known risk factors for hepatitis C. Sexually transmitted infections (STIs) You should be screened each year for STIs, including gonorrhea and chlamydia, if: You are sexually active and are younger than 85 years of age. You are older than 85 years of age and your   health care provider tells you that you are at risk for this type of infection. Your sexual activity has changed since you were last screened, and you are at increased risk for chlamydia or gonorrhea. Ask your health care provider if you are at risk. Ask your health care provider about whether you are at high risk for HIV. Your health care provider  may recommend a prescription medicine to help prevent HIV infection. If you choose to take medicine to prevent HIV, you should first get tested for HIV. You should then be tested every 3 months for as long as you are taking the medicine. Follow these instructions at home: Alcohol use Do not drink alcohol if your health care provider tells you not to drink. If you drink alcohol: Limit how much you have to 0-2 drinks a day. Know how much alcohol is in your drink. In the [place] one drink equals one 12 oz bottle of beer (355 mL), one 5 oz glass of wine (148 mL), or one 1 oz glass of hard liquor (44 mL). Lifestyle Do not use any products that contain nicotine or tobacco. These products include cigarettes, chewing tobacco, and vaping devices, such as e-cigarettes. If you need help quitting, ask your health care provider. Do not use street drugs. Do not share needles. Ask your health care provider for help if you need support or information about quitting drugs. General instructions Schedule regular health, dental, and eye exams. Stay current with your vaccines. Tell your health care provider if: You often feel depressed. You have ever been abused or do not feel safe at home. Summary Adopting a healthy lifestyle and getting preventive care are important in promoting health and wellness. Follow your health care provider's instructions about healthy diet, exercising, and getting tested or screened for diseases. Follow your health care provider's instructions on monitoring your cholesterol and blood pressure. This information is not intended to replace advice given to you by your health care provider. Make sure you discuss any questions you have with your health care provider. Document Revised: 10/04/2020 Document Reviewed: 10/04/2020 Elsevier Patient Education  2023 Elsevier Inc.  

## 2021-12-17 MED ORDER — SHINGRIX 50 MCG/0.5ML IM SUSR: 0.5000 mL | Freq: Once | INTRAMUSCULAR | 1 refills | Status: AC

## 2021-12-31 ENCOUNTER — Other Ambulatory Visit: Payer: Self-pay | Admitting: Internal Medicine

## 2021-12-31 DIAGNOSIS — E785 Hyperlipidemia, unspecified: Secondary | ICD-10-CM

## 2021-12-31 DIAGNOSIS — I10 Essential (primary) hypertension: Secondary | ICD-10-CM

## 2021-12-31 DIAGNOSIS — I700 Atherosclerosis of aorta: Secondary | ICD-10-CM

## 2022-02-07 ENCOUNTER — Ambulatory Visit (INDEPENDENT_AMBULATORY_CARE_PROVIDER_SITE_OTHER): Payer: PPO

## 2022-02-07 DIAGNOSIS — Z23 Encounter for immunization: Secondary | ICD-10-CM

## 2022-03-03 ENCOUNTER — Ambulatory Visit (INDEPENDENT_AMBULATORY_CARE_PROVIDER_SITE_OTHER): Payer: PPO

## 2022-03-03 VITALS — Ht 66.0 in | Wt 188.0 lb

## 2022-03-03 DIAGNOSIS — Z0000 Encounter for general adult medical examination without abnormal findings: Secondary | ICD-10-CM | POA: Diagnosis not present

## 2022-03-03 NOTE — Patient Instructions (Signed)
Christopher Dudley , Thank you for taking time to come for your Medicare Wellness Visit. I appreciate your ongoing commitment to your health goals. Please review the following plan we discussed and let me know if I can assist you in the future.   These are the goals we discussed:  Goals      DIET - INCREASE WATER INTAKE        This is a list of the screening recommended for you and due dates:  Health Maintenance  Topic Date Due   Zoster (Shingles) Vaccine (1 of 2) Never done   COVID-19 Vaccine (3 - Moderna risk series) 09/09/2019   Tetanus Vaccine  02/23/2030   Pneumonia Vaccine  Completed   Flu Shot  Completed   HPV Vaccine  Aged Out    Advanced directives: Please bring a copy of your health care power of attorney and living will to the office to be added to your chart at your convenience.   Conditions/risks identified: Aim for 30 minutes of exercise or brisk walking, 6-8 glasses of water, and 5 servings of fruits and vegetables each day.   Next appointment: Follow up in one year for your annual wellness visit.   Preventive Care 11 Years and Older, Male  Preventive care refers to lifestyle choices and visits with your health care provider that can promote health and wellness. What does preventive care include? A yearly physical exam. This is also called an annual well check. Dental exams once or twice a year. Routine eye exams. Ask your health care provider how often you should have your eyes checked. Personal lifestyle choices, including: Daily care of your teeth and gums. Regular physical activity. Eating a healthy diet. Avoiding tobacco and drug use. Limiting alcohol use. Practicing safe sex. Taking low doses of aspirin every day. Taking vitamin and mineral supplements as recommended by your health care provider. What happens during an annual well check? The services and screenings done by your health care provider during your annual well check will depend on your age,  overall health, lifestyle risk factors, and family history of disease. Counseling  Your health care provider may ask you questions about your: Alcohol use. Tobacco use. Drug use. Emotional well-being. Home and relationship well-being. Sexual activity. Eating habits. History of falls. Memory and ability to understand (cognition). Work and work Statistician. Screening  You may have the following tests or measurements: Height, weight, and BMI. Blood pressure. Lipid and cholesterol levels. These may be checked every 5 years, or more frequently if you are over 77 years old. Skin check. Lung cancer screening. You may have this screening every year starting at age 49 if you have a 30-pack-year history of smoking and currently smoke or have quit within the past 15 years. Fecal occult blood test (FOBT) of the stool. You may have this test every year starting at age 23. Flexible sigmoidoscopy or colonoscopy. You may have a sigmoidoscopy every 5 years or a colonoscopy every 10 years starting at age 57. Prostate cancer screening. Recommendations will vary depending on your family history and other risks. Hepatitis C blood test. Hepatitis B blood test. Sexually transmitted disease (STD) testing. Diabetes screening. This is done by checking your blood sugar (glucose) after you have not eaten for a while (fasting). You may have this done every 1-3 years. Abdominal aortic aneurysm (AAA) screening. You may need this if you are a current or former smoker. Osteoporosis. You may be screened starting at age 42 if you are at  high risk. Talk with your health care provider about your test results, treatment options, and if necessary, the need for more tests. Vaccines  Your health care provider may recommend certain vaccines, such as: Influenza vaccine. This is recommended every year. Tetanus, diphtheria, and acellular pertussis (Tdap, Td) vaccine. You may need a Td booster every 10 years. Zoster vaccine.  You may need this after age 15. Pneumococcal 13-valent conjugate (PCV13) vaccine. One dose is recommended after age 24. Pneumococcal polysaccharide (PPSV23) vaccine. One dose is recommended after age 83. Talk to your health care provider about which screenings and vaccines you need and how often you need them. This information is not intended to replace advice given to you by your health care provider. Make sure you discuss any questions you have with your health care provider. Document Released: 06/11/2015 Document Revised: 02/02/2016 Document Reviewed: 03/16/2015 Elsevier Interactive Patient Education  2017 [place] Prevention in the Home Falls can cause injuries. They can happen to people of all ages. There are many things you can do to make your home safe and to help prevent falls. What can I do on the outside of my home? Regularly fix the edges of walkways and driveways and fix any cracks. Remove anything that might make you trip as you walk through a door, such as a raised step or threshold. Trim any bushes or trees on the path to your home. Use bright outdoor lighting. Clear any walking paths of anything that might make someone trip, such as rocks or tools. Regularly check to see if handrails are loose or broken. Make sure that both sides of any steps have handrails. Any raised decks and porches should have guardrails on the edges. Have any leaves, snow, or ice cleared regularly. Use sand or salt on walking paths during winter. Clean up any spills in your garage right away. This includes oil or grease spills. What can I do in the bathroom? Use night lights. Install grab bars by the toilet and in the tub and shower. Do not use towel bars as grab bars. Use non-skid mats or decals in the tub or shower. If you need to sit down in the shower, use a plastic, non-slip stool. Keep the floor dry. Clean up any water that spills on the floor as soon as it happens. Remove soap  buildup in the tub or shower regularly. Attach bath mats securely with double-sided non-slip rug tape. Do not have throw rugs and other things on the floor that can make you trip. What can I do in the bedroom? Use night lights. Make sure that you have a light by your bed that is easy to reach. Do not use any sheets or blankets that are too big for your bed. They should not hang down onto the floor. Have a firm chair that has side arms. You can use this for support while you get dressed. Do not have throw rugs and other things on the floor that can make you trip. What can I do in the kitchen? Clean up any spills right away. Avoid walking on wet floors. Keep items that you use a lot in easy-to-reach places. If you need to reach something above you, use a strong step stool that has a grab bar. Keep electrical cords out of the way. Do not use floor polish or wax that makes floors slippery. If you must use wax, use non-skid floor wax. Do not have throw rugs and other things on the floor that  can make you trip. What can I do with my stairs? Do not leave any items on the stairs. Make sure that there are handrails on both sides of the stairs and use them. Fix handrails that are broken or loose. Make sure that handrails are as long as the stairways. Check any carpeting to make sure that it is firmly attached to the stairs. Fix any carpet that is loose or worn. Avoid having throw rugs at the top or bottom of the stairs. If you do have throw rugs, attach them to the floor with carpet tape. Make sure that you have a light switch at the top of the stairs and the bottom of the stairs. If you do not have them, ask someone to add them for you. What else can I do to help prevent falls? Wear shoes that: Do not have high heels. Have rubber bottoms. Are comfortable and fit you well. Are closed at the toe. Do not wear sandals. If you use a stepladder: Make sure that it is fully opened. Do not climb a closed  stepladder. Make sure that both sides of the stepladder are locked into place. Ask someone to hold it for you, if possible. Clearly mark and make sure that you can see: Any grab bars or handrails. First and last steps. Where the edge of each step is. Use tools that help you move around (mobility aids) if they are needed. These include: Canes. Walkers. Scooters. Crutches. Turn on the lights when you go into a dark area. Replace any light bulbs as soon as they burn out. Set up your furniture so you have a clear path. Avoid moving your furniture around. If any of your floors are uneven, fix them. If there are any pets around you, be aware of where they are. Review your medicines with your doctor. Some medicines can make you feel dizzy. This can increase your chance of falling. Ask your doctor what other things that you can do to help prevent falls. This information is not intended to replace advice given to you by your health care provider. Make sure you discuss any questions you have with your health care provider. Document Released: 03/11/2009 Document Revised: 10/21/2015 Document Reviewed: 06/19/2014 Elsevier Interactive Patient Education  2017 [employer].

## 2022-03-03 NOTE — Progress Notes (Signed)
Subjective:   Christopher Dudley is a 87 y.o. male who presents for Medicare Annual/Subsequent preventive examination.   Virtual Visit via Telephone Note  I connected with  Christopher Dudley on 03/03/22 at  9:15 AM EDT by telephone and verified that I am speaking with the correct person using two identifiers.  Location: Patient: home  Provider: [facility]  Persons participating in the virtual visit: patient/Nurse Health Advisor   I discussed the limitations, risks, security and privacy concerns of performing an evaluation and management service by telephone and the availability of in person appointments. The patient expressed understanding and agreed to proceed.  Interactive audio and video telecommunications were attempted between this nurse and patient, however failed, due to patient having technical difficulties OR patient did not have access to video capability.  We continued and completed visit with audio only.  Some vital signs may be absent or patient reported.   Daphane Shepherd, LPN  Review of Systems     Cardiac Risk Factors include: advanced age (>49mn, >>86women);hypertension;male gender     Objective:    Today's Vitals   03/03/22 0916  Weight: 188 lb (85.3 kg)  Height: '5\' 6"'$  (1.676 m)   Body mass index is 30.34 kg/m.     03/03/2022    9:19 AM 01/19/2020   11:06 AM 01/26/2017   10:03 AM 01/11/2015   11:50 AM 01/11/2015    8:57 AM  Advanced Directives  Does Patient Have a Medical Advance Directive? Yes Yes Yes Yes Yes  Type of [place]of [place] will Living will Healthcare Power of [place] will [place] will  Does patient want to make changes to medical advance directive?   Yes (ED - Information included in AVS) No - Patient declined No - Patient declined  Copy of HBl[place]in Chart? No - copy requested  Yes  Yes    Current Medications  (verified) Outpatient Encounter Medications as of 03/03/2022  Medication Sig   irbesartan (AVAPRO) 150 MG tablet TAKE 1 TABLET BY MOUTH EVERY DAY   Multiple Vitamin (MULTIVITAMIN WITH MINERALS) TABS tablet Take 1 tablet by mouth daily.    rosuvastatin (CRESTOR) 10 MG tablet TAKE 1 TABLET BY MOUTH EVERY DAY   No facility-administered encounter medications on file as of 03/03/2022.    Allergies (verified) Patient has no known allergies.   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 ([place])   LIGAMENT REPAIR Right    right arm   LUMBAR LAMINECTOMY/DECOMPRESSION MICRODISCECTOMY Bilateral 01/21/2020   Procedure: LAMINECTOMY AND FORAMINOTOMY BILATERAL, LUMBAR THREE- LUMBAR FOUR, LUMBAR FOUR- LUMBAR FIVE;  Surgeon: JNewman Pies MD;  [facility]: [facility]  Service: Neurosurgery;  Laterality: Bilateral;   pilondial cyst excision  87yrs old   ROTATOR CUFF REPAIR  07   Family History  Problem Relation Age of Onset   Cancer Mother        ovarian   Coronary artery disease Father    Heart attack Father    Gallbladder disease Father  Hypertension Father    Cancer Father        prostate   Heart disease Father        CAD/MI-fatal   Diabetes Neg Hx    Social History   Socioeconomic History   Marital status: Widowed    Spouse name: Not on file   Number of children: Not on file   Years of education: 16   Highest education level: Not on file  Occupational History   Occupation: Environmental health practitioner, Government social research officer    Comment: retired  Tobacco Use   Smoking status: Never   Smokeless tobacco: Never  Substance and Sexual Activity   Alcohol use: No    Comment: in reocver since 2001   Drug use: No   Sexual  activity: Not Currently  Other Topics Concern   Not on file  Social History Narrative   [employer] BS degree.  [employer] 4 years.  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 married '64 - 1 year divorce; married '70.  1 son - premature, perinatal death.  Wife with progressive dementia/confusion. Also s/p beast cancer '07. Retired. 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 Hobby - golf. Plays the market-doing well. ACP: CPR-yes; mechanical ventilation - yes, for short term; no futile care for vegative state or irreversible disease.    Social Determinants of Health   Financial Resource Strain: Low Risk  (03/03/2022)   Overall Financial Resource Strain (CARDIA)    Difficulty of Paying Living Expenses: Not hard at all  Food Insecurity: No Food Insecurity (03/03/2022)   Hunger Vital Sign    Worried About Running Out of Food in the Last Year: Never true    Ran Out of Food in the Last Year: Never true  Transportation Needs: No Transportation Needs (03/03/2022)   PRAPARE - Hydrologist (Medical): No    Lack of Transportation (Non-Medical): No  Physical Activity: Sufficiently Active (03/03/2022)   Exercise Vital Sign    Days of Exercise per Week: 5 days    Minutes of Exercise per Session: 40 min  Stress: No Stress Concern Present (03/03/2022)   [place]    Feeling of Stress : Not at all  Social Connections: Moderately Isolated (03/03/2022)   Social Connection and Isolation Panel [NHANES]    Frequency of Communication with Friends and Family: More than three times a week    Frequency of Social Gatherings with Friends and Family: More than three times a week    Attends Religious Services: 1 to 4 times per year    Active Member of [employer] or Organizations: No    Attends Archivist Meetings: Never    Marital Status: Widowed    Tobacco Counseling Counseling given: Not Answered   Clinical  Intake:  Pre-visit preparation completed: Yes  Pain : No/denies pain     Nutritional Risks: None Diabetes: No  How often do you need to have someone help you when you read instructions, pamphlets, or other written materials from your doctor or pharmacy?: 1 - Never  Diabetic?no   Interpreter Needed?: No  Information entered by :: Jadene Pierini, LPN   Activities of Daily Living    03/03/2022    9:20 AM  In your present state of health, do you have any difficulty performing the following activities:  Hearing? 0  Vision? 0  Difficulty concentrating or making decisions? 0  Walking or climbing stairs? 0  Dressing or bathing? 0  Doing errands, shopping? 0  Preparing Food and eating ? N  Using the Toilet? N  In the past six months, have you accidently leaked urine? N  Do you have problems with loss of bowel control? N  Managing your Medications? N  Managing your Finances? N  Housekeeping or managing your Housekeeping? N    Patient Care Team: Janith Lima, MD as PCP - General (Internal Medicine) Lindwood Coke, MD (Dermatology)  Indicate any recent Medical Services you may have received from other than [facility] providers in the past year (date may be approximate).     Assessment:   This is a routine wellness examination for [place].  Hearing/Vision screen Vision Screening - Comments:: Annual eye exams wear glasses   Dietary issues and exercise activities discussed: Current Exercise Habits: Home exercise routine, Type of exercise: walking, Time (Minutes): 40, Frequency (Times/Week): 5, Weekly Exercise (Minutes/Week): 200, Intensity: Mild, Exercise limited by: None identified   Goals Addressed             This Visit's Progress    DIET - INCREASE WATER INTAKE         Depression Screen    03/03/2022    9:18 AM 12/12/2021    2:35 PM 02/24/2020    1:46 PM 02/05/2019    1:16 PM 01/30/2018   10:11 AM 01/30/2018    9:28 AM 01/26/2017   10:05 AM  PHQ 2/9 Scores  PHQ - 2  Score 0 0 0 0 0 0 0  PHQ- 9 Score  0         Fall Risk    03/03/2022    9:17 AM 12/12/2021    2:35 PM 02/24/2020    1:46 PM 02/05/2019    1:16 PM 01/30/2018   10:11 AM  [facility] in the past year? 0 0 0 0 No  Number falls in past yr: 0   0   Injury with Fall? 0   0   Risk for fall due to : No Fall Risks      Follow up Falls prevention discussed   Falls evaluation completed     FALL RISK PREVENTION PERTAINING TO THE HOME:  Any stairs in or around the home? No  If so, are there any without handrails? No  Home free of loose throw rugs in walkways, pet beds, electrical cords, etc? Yes  Adequate lighting in your home to reduce risk of falls? Yes   ASSISTIVE DEVICES UTILIZED TO PREVENT FALLS:  Life alert? No  Use of a cane, walker or w/c? No  Grab bars in the bathroom? Yes  Shower chair or bench in shower? Yes  Elevated toilet seat or a handicapped toilet? Yes      03/03/2022    9:20 AM  6CIT Screen  What Year? 0 points  What month? 0 points  What time? 0 points  Count back from 20 0 points  Months in reverse 0 points  Repeat phrase 0 points  Total Score 0 points    Immunizations Immunization History  Administered Date(s) Administered   Fluad Quad(high Dose 65+) 02/05/2019, 02/24/2020, 02/01/2021, 02/07/2022   Influenza Split 02/13/2011, 02/08/2012   Influenza Whole 02/21/2008, 03/11/2009, 02/18/2010   Influenza, High Dose Seasonal PF 01/28/2015, 02/01/2016, 01/24/2017, 01/30/2018   Influenza,inj,Quad PF,6+ Mos 02/06/2013, 02/10/2014   Moderna Sars-Covid-2 Vaccination 07/14/2019, 08/12/2019   Pneumococcal Conjugate-13 01/05/2014   Pneumococcal Polysaccharide-23 01/17/2016, 12/12/2021   Td 09/24/2009   Tdap 02/24/2020   Zoster, Live  01/17/2016    TDAP status: Up to date  Flu Vaccine status: Up to date  Pneumococcal vaccine status: Up to date  Covid-19 vaccine status: Completed vaccines  Qualifies for Shingles Vaccine? Yes   Zostavax completed No    Shingrix Completed?: No.    Education has been provided regarding the importance of this vaccine. Patient has been advised to call insurance company to determine out of pocket expense if they have not yet received this vaccine. Advised may also receive vaccine at local pharmacy or Health Dept. Verbalized acceptance and understanding.  Screening Tests Health Maintenance  Topic Date Due   Zoster Vaccines- Shingrix (1 of 2) Never done   COVID-19 Vaccine (3 - Moderna risk series) 09/09/2019   TETANUS/TDAP  02/23/2030   Pneumonia Vaccine 39+ Years old  Completed   INFLUENZA VACCINE  Completed   HPV VACCINES  Aged Out    Health Maintenance  Health Maintenance Due  Topic Date Due   Zoster Vaccines- Shingrix (1 of 2) Never done   COVID-19 Vaccine (3 - Moderna risk series) 09/09/2019    Colorectal cancer screening: No longer required.   Lung Cancer Screening: (Low Dose CT Chest recommended if Age 52-80 years, 30 pack-year currently smoking OR have quit w/in 15years.) does not qualify.   Lung Cancer Screening Referral: n/a  Additional Screening:  Hepatitis C Screening: does not qualify;   Vision Screening: Recommended annual ophthalmology exams for early detection of glaucoma and other disorders of the eye. Is the patient up to date with their annual eye exam?  Yes  Who is the provider or what is the name of the office in which the patient attends annual eye exams? Dr.Shipiro If pt is not established with a provider, would they like to be referred to a provider to establish care? No .   Dental Screening: Recommended annual dental exams for proper oral hygiene  Community Resource Referral / Chronic Care Management: CRR required this visit?  No   CCM required this visit?  No      Plan:     I have personally reviewed and noted the following in the patient's chart:   Medical and social history Use of alcohol, tobacco or illicit drugs  Current medications and supplements  including opioid prescriptions. Patient is not currently taking opioid prescriptions. Functional ability and status Nutritional status Physical activity Advanced directives List of other physicians Hospitalizations, surgeries, and ER visits in previous 12 months Vitals Screenings to include cognitive, depression, and falls Referrals and appointments  In addition, I have reviewed and discussed with patient certain preventive protocols, quality metrics, and best practice recommendations. A written personalized care plan for preventive services as well as general preventive health recommendations were provided to patient.     Daphane Shepherd, LPN   [id number]   Nurse Notes: None

## 2022-04-09 IMAGING — CR DG LUMBAR SPINE 1V
1 series · 1 of 1 positions shown · non-contrast
Comparison: MRI lumbar spine dated 12/24/2019

CLINICAL DATA: L3-5 laminectomy

EXAM:
LUMBAR SPINE - 1 VIEW

[lateral]
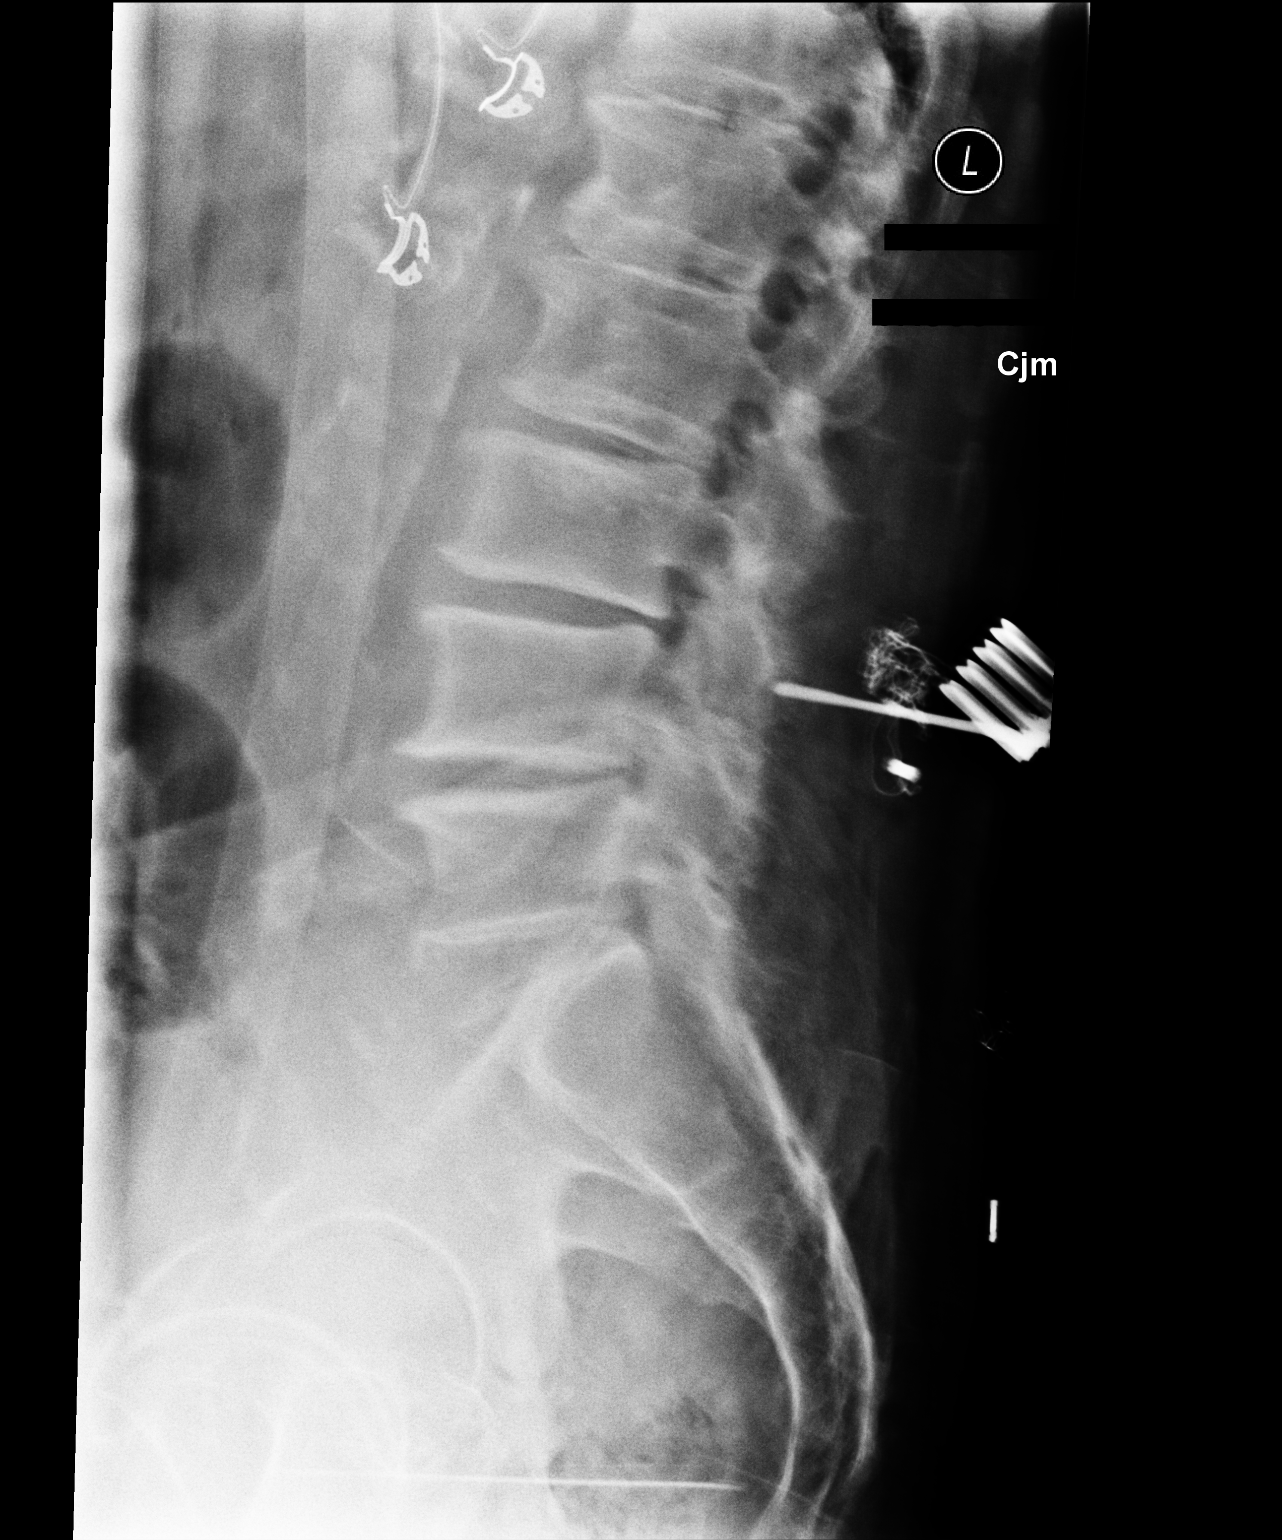

[1 of 1 positions shown; findings below may reference images not displayed]

FINDINGS: Single lateral view of the lumbar spine demonstrates a needle at the
L3-4 level, posterior to the superior endplate of the L4 vertebral
body.
IMPRESSION: Lumbar localization as above.

## 2022-06-05 DIAGNOSIS — D485 Neoplasm of uncertain behavior of skin: Secondary | ICD-10-CM | POA: Diagnosis not present

## 2022-06-05 DIAGNOSIS — L821 Other seborrheic keratosis: Secondary | ICD-10-CM | POA: Diagnosis not present

## 2022-06-05 DIAGNOSIS — Z85828 Personal history of other malignant neoplasm of skin: Secondary | ICD-10-CM | POA: Diagnosis not present

## 2022-06-05 DIAGNOSIS — L570 Actinic keratosis: Secondary | ICD-10-CM | POA: Diagnosis not present

## 2022-06-05 DIAGNOSIS — D1801 Hemangioma of skin and subcutaneous tissue: Secondary | ICD-10-CM | POA: Diagnosis not present

## 2022-06-05 DIAGNOSIS — C44519 Basal cell carcinoma of skin of other part of trunk: Secondary | ICD-10-CM | POA: Diagnosis not present

## 2022-06-05 DIAGNOSIS — D2262 Melanocytic nevi of left upper limb, including shoulder: Secondary | ICD-10-CM | POA: Diagnosis not present

## 2022-06-13 DIAGNOSIS — M542 Cervicalgia: Secondary | ICD-10-CM | POA: Diagnosis not present

## 2022-06-13 DIAGNOSIS — M9902 Segmental and somatic dysfunction of thoracic region: Secondary | ICD-10-CM | POA: Diagnosis not present

## 2022-06-13 DIAGNOSIS — M545 Low back pain, unspecified: Secondary | ICD-10-CM | POA: Diagnosis not present

## 2022-06-13 DIAGNOSIS — M9903 Segmental and somatic dysfunction of lumbar region: Secondary | ICD-10-CM | POA: Diagnosis not present

## 2022-06-13 DIAGNOSIS — M9901 Segmental and somatic dysfunction of cervical region: Secondary | ICD-10-CM | POA: Diagnosis not present

## 2022-06-13 DIAGNOSIS — M546 Pain in thoracic spine: Secondary | ICD-10-CM | POA: Diagnosis not present

## 2022-06-13 DIAGNOSIS — M62830 Muscle spasm of back: Secondary | ICD-10-CM | POA: Diagnosis not present

## 2022-06-13 DIAGNOSIS — M62838 Other muscle spasm: Secondary | ICD-10-CM | POA: Diagnosis not present

## 2022-08-09 DIAGNOSIS — H25813 Combined forms of age-related cataract, bilateral: Secondary | ICD-10-CM | POA: Diagnosis not present

## 2022-08-24 LAB — CBC AND DIFFERENTIAL
HCT: 43 (ref 41–53)
Hemoglobin: 14.2 (ref 13.5–17.5)
Platelets: 198 10*3/uL (ref 150–400)
WBC: 7.1

## 2022-08-24 LAB — BASIC METABOLIC PANEL
BUN: 12 (ref 4–21)
CO2: 28 — AB (ref 13–22)
Chloride: 105 (ref 99–108)
Creatinine: 0.9 (ref 0.6–1.3)
Glucose: 98
Potassium: 4.3 mEq/L (ref 3.5–5.1)
Sodium: 138 (ref 137–147)

## 2022-08-24 LAB — COMPREHENSIVE METABOLIC PANEL
Calcium: 9.1 (ref 8.7–10.7)
eGFR: 81

## 2022-09-07 ENCOUNTER — Other Ambulatory Visit: Payer: Self-pay | Admitting: Internal Medicine

## 2022-09-07 DIAGNOSIS — E785 Hyperlipidemia, unspecified: Secondary | ICD-10-CM

## 2022-09-07 DIAGNOSIS — I700 Atherosclerosis of aorta: Secondary | ICD-10-CM

## 2022-09-07 DIAGNOSIS — I10 Essential (primary) hypertension: Secondary | ICD-10-CM

## 2022-09-17 ENCOUNTER — Other Ambulatory Visit: Payer: Self-pay | Admitting: Internal Medicine

## 2022-09-17 DIAGNOSIS — I700 Atherosclerosis of aorta: Secondary | ICD-10-CM

## 2022-09-17 DIAGNOSIS — I10 Essential (primary) hypertension: Secondary | ICD-10-CM

## 2022-09-17 DIAGNOSIS — E785 Hyperlipidemia, unspecified: Secondary | ICD-10-CM

## 2022-09-18 ENCOUNTER — Telehealth: Payer: Self-pay

## 2022-09-18 ENCOUNTER — Encounter: Payer: Self-pay | Admitting: Internal Medicine

## 2022-09-18 ENCOUNTER — Ambulatory Visit (INDEPENDENT_AMBULATORY_CARE_PROVIDER_SITE_OTHER): Payer: PPO | Admitting: Internal Medicine

## 2022-09-18 VITALS — BP 138/76 | HR 73 | Temp 98.0°F | Ht 66.0 in | Wt 194.0 lb

## 2022-09-18 DIAGNOSIS — Z0001 Encounter for general adult medical examination with abnormal findings: Secondary | ICD-10-CM | POA: Diagnosis not present

## 2022-09-18 DIAGNOSIS — E785 Hyperlipidemia, unspecified: Secondary | ICD-10-CM | POA: Diagnosis not present

## 2022-09-18 DIAGNOSIS — I700 Atherosclerosis of aorta: Secondary | ICD-10-CM

## 2022-09-18 DIAGNOSIS — I10 Essential (primary) hypertension: Secondary | ICD-10-CM | POA: Diagnosis not present

## 2022-09-18 MED ORDER — IRBESARTAN 150 MG PO TABS: 150.0000 mg | ORAL_TABLET | Freq: Every day | ORAL | 1 refills | Status: DC

## 2022-09-18 MED ORDER — ROSUVASTATIN CALCIUM 10 MG PO TABS: 10.0000 mg | ORAL_TABLET | Freq: Every day | ORAL | 1 refills | Status: DC

## 2022-09-18 NOTE — Patient Instructions (Signed)
Hypertension, Adult High blood pressure (hypertension) is when the force of blood pumping through the arteries is too strong. The arteries are the blood vessels that carry blood from the heart throughout the body. Hypertension forces the heart to work harder to pump blood and may cause arteries to become narrow or stiff. Untreated or uncontrolled hypertension can lead to a heart attack, heart failure, a stroke, kidney disease, and other problems. A blood pressure reading consists of a higher number over a lower number. Ideally, your blood pressure should be below 120/80. The first ("top") number is called the systolic pressure. It is a measure of the pressure in your arteries as your heart beats. The second ("bottom") number is called the diastolic pressure. It is a measure of the pressure in your arteries as the heart relaxes. What are the causes? The exact cause of this condition is not known. There are some conditions that result in high blood pressure. What increases the risk? Certain factors may make you more likely to develop high blood pressure. Some of these risk factors are under your control, including: Smoking. Not getting enough exercise or physical activity. Being overweight. Having too much fat, sugar, calories, or salt (sodium) in your diet. Drinking too much alcohol. Other risk factors include: Having a personal history of heart disease, diabetes, high cholesterol, or kidney disease. Stress. Having a family history of high blood pressure and high cholesterol. Having obstructive sleep apnea. Age. The risk increases with age. What are the signs or symptoms? High blood pressure may not cause symptoms. Very high blood pressure (hypertensive crisis) may cause: Headache. Fast or irregular heartbeats (palpitations). Shortness of breath. Nosebleed. Nausea and vomiting. Vision changes. Severe chest pain, dizziness, and seizures. How is this diagnosed? This condition is diagnosed by  measuring your blood pressure while you are seated, with your arm resting on a flat surface, your legs uncrossed, and your feet flat on the floor. The cuff of the blood pressure monitor will be placed directly against the skin of your upper arm at the level of your heart. Blood pressure should be measured at least twice using the same arm. Certain conditions can cause a difference in blood pressure between your right and left arms. If you have a high blood pressure reading during one visit or you have normal blood pressure with other risk factors, you may be asked to: Return on a different day to have your blood pressure checked again. Monitor your blood pressure at home for 1 week or longer. If you are diagnosed with hypertension, you may have other blood or imaging tests to help your health care provider understand your overall risk for other conditions. How is this treated? This condition is treated by making healthy lifestyle changes, such as eating healthy foods, exercising more, and reducing your alcohol intake. You may be referred for counseling on a healthy diet and physical activity. Your health care provider may prescribe medicine if lifestyle changes are not enough to get your blood pressure under control and if: Your systolic blood pressure is above 130. Your diastolic blood pressure is above 80. Your personal target blood pressure may vary depending on your medical conditions, your age, and other factors. Follow these instructions at home: Eating and drinking  Eat a diet that is high in fiber and potassium, and low in sodium, added sugar, and fat. An example of this eating plan is called the DASH diet. DASH stands for Dietary Approaches to Stop Hypertension. To eat this way: Eat   plenty of fresh fruits and vegetables. Try to fill one half of your plate at each meal with fruits and vegetables. Eat whole grains, such as whole-wheat pasta, brown rice, or whole-grain bread. Fill about one  fourth of your plate with whole grains. Eat or drink low-fat dairy products, such as skim milk or low-fat yogurt. Avoid fatty cuts of meat, processed or cured meats, and poultry with skin. Fill about one fourth of your plate with lean proteins, such as fish, chicken without skin, beans, eggs, or tofu. Avoid pre-made and processed foods. These tend to be higher in sodium, added sugar, and fat. Reduce your daily sodium intake. Many people with hypertension should eat less than 1,500 mg of sodium a day. Do not drink alcohol if: Your health care provider tells you not to drink. You are pregnant, may be pregnant, or are planning to become pregnant. If you drink alcohol: Limit how much you have to: 0-1 drink a day for women. 0-2 drinks a day for men. Know how much alcohol is in your drink. In the [place] one drink equals one 12 oz bottle of beer (355 mL), one 5 oz glass of wine (148 mL), or one 1 oz glass of hard liquor (44 mL). Lifestyle  Work with your health care provider to maintain a healthy body weight or to lose weight. Ask what an ideal weight is for you. Get at least 30 minutes of exercise that causes your heart to beat faster (aerobic exercise) most days of the week. Activities may include walking, swimming, or biking. Include exercise to strengthen your muscles (resistance exercise), such as Pilates or lifting weights, as part of your weekly exercise routine. Try to do these types of exercises for 30 minutes at least 3 days a week. Do not use any products that contain nicotine or tobacco. These products include cigarettes, chewing tobacco, and vaping devices, such as e-cigarettes. If you need help quitting, ask your health care provider. Monitor your blood pressure at home as told by your health care provider. Keep all follow-up visits. This is important. Medicines Take over-the-counter and prescription medicines only as told by your health care provider. Follow directions carefully. Blood  pressure medicines must be taken as prescribed. Do not skip doses of blood pressure medicine. Doing this puts you at risk for problems and can make the medicine less effective. Ask your health care provider about side effects or reactions to medicines that you should watch for. Contact a health care provider if you: Think you are having a reaction to a medicine you are taking. Have headaches that keep coming back (recurring). Feel dizzy. Have swelling in your ankles. Have trouble with your vision. Get help right away if you: Develop a severe headache or confusion. Have unusual weakness or numbness. Feel faint. Have severe pain in your chest or abdomen. Vomit repeatedly. Have trouble breathing. These symptoms may be an emergency. Get help right away. Call 911. Do not wait to see if the symptoms will go away. Do not drive yourself to the hospital. Summary Hypertension is when the force of blood pumping through your arteries is too strong. If this condition is not controlled, it may put you at risk for serious complications. Your personal target blood pressure may vary depending on your medical conditions, your age, and other factors. For most people, a normal blood pressure is less than 120/80. Hypertension is treated with lifestyle changes, medicines, or a combination of both. Lifestyle changes include losing weight, eating a healthy,   low-sodium diet, exercising more, and limiting alcohol. This information is not intended to replace advice given to you by your health care provider. Make sure you discuss any questions you have with your health care provider. Document Revised: 03/22/2021 Document Reviewed: 03/22/2021 Elsevier Patient Education  2023 [employer]  

## 2022-09-18 NOTE — Progress Notes (Signed)
Subjective:  Patient ID: Christopher Dudley, male    DOB: June 15, 1934  Age: 88 y.o. MRN: [id number]  CC: Hypertension and Hyperlipidemia   HPI Christopher Dudley presents for f/up ---  He recently had labs done at the [place].  He is active and denies chest pain, shortness of breath, diaphoresis, or edema.  Outpatient Medications Prior to Visit  Medication Sig Dispense Refill   Multiple Vitamin (MULTIVITAMIN WITH MINERALS) TABS tablet Take 1 tablet by mouth daily.      irbesartan (AVAPRO) 150 MG tablet TAKE 1 TABLET BY MOUTH EVERY DAY 90 tablet 1   rosuvastatin (CRESTOR) 10 MG tablet TAKE 1 TABLET BY MOUTH EVERY DAY 90 tablet 1   No facility-administered medications prior to visit.    ROS Review of Systems  Constitutional: Negative.  Negative for diaphoresis and fatigue.  HENT: Negative.    Eyes: Negative.  Negative for visual disturbance.  Respiratory:  Negative for cough, chest tightness, shortness of breath and wheezing.   Cardiovascular:  Negative for chest pain, palpitations and leg swelling.  Gastrointestinal:  Negative for abdominal pain, constipation, diarrhea, nausea and vomiting.  Endocrine: Negative.   Genitourinary: Negative.  Negative for difficulty urinating and dysuria.  Musculoskeletal: Negative.  Negative for arthralgias and myalgias.  Skin: Negative.   Neurological: Negative.  Negative for dizziness and weakness.  Hematological:  Negative for adenopathy. Does not bruise/bleed easily.  Psychiatric/Behavioral: Negative.      Objective:  BP 138/76 (BP Location: Left Arm, Patient Position: Sitting, Cuff Size: Large)   Pulse 73   Temp 98 F (36.7 C) (Oral)   Ht 5\' 6"  (1.676 m)   Wt 194 lb (88 kg)   SpO2 97%   BMI 31.31 kg/m   BP Readings from Last 3 Encounters:  09/18/22 138/76  12/12/21 128/68  08/23/20 136/68    Wt Readings from Last 3 Encounters:  09/18/22 194 lb (88 kg)  03/03/22 188 lb (85.3 kg)  12/12/21 196 lb (88.9 kg)    Physical Exam Vitals  reviewed.  HENT:     Nose: Nose normal.     Mouth/Throat:     Mouth: Mucous membranes are moist.  Eyes:     General: No scleral icterus.    Conjunctiva/sclera: Conjunctivae normal.  Cardiovascular:     Rate and Rhythm: Normal rate and regular rhythm.     Heart sounds: No murmur heard. Pulmonary:     Effort: Pulmonary effort is normal.     Breath sounds: No stridor. No wheezing, rhonchi or rales.  Abdominal:     Palpations: There is no mass.     Tenderness: There is no abdominal tenderness. There is no guarding.     Hernia: No hernia is present.  Musculoskeletal:        General: Normal range of motion.     Cervical back: Neck supple.     Right lower leg: No edema.     Left lower leg: No edema.  Lymphadenopathy:     Cervical: No cervical adenopathy.  Skin:    General: Skin is warm and dry.  Neurological:     General: No focal deficit present.     Mental Status: He is alert. Mental status is at baseline.  Psychiatric:        Mood and Affect: Mood normal.        Behavior: Behavior normal.     Lab Results  Component Value Date   WBC 7.1 08/24/2022   HGB 14.2 08/24/2022  HCT 43 08/24/2022   PLT 198 08/24/2022   GLUCOSE 113 (H) 01/19/2020   CHOL 124 08/20/2020   TRIG 56 08/20/2020   HDL 52 08/20/2020   LDLCALC 61 08/20/2020   ALT 27 07/17/2018   AST 17 07/17/2018   NA 138 08/24/2022   K 4.3 08/24/2022   CL 105 08/24/2022   CREATININE 0.9 08/24/2022   BUN 12 08/24/2022   CO2 28 (A) 08/24/2022   TSH 1.06 01/20/2020   PSA 4.27 01/24/2019   MICROALBUR 0.598 07/04/2017    DG Lumbar Spine 1 View  Result Date: 01/21/2020 CLINICAL DATA:  L3-5 laminectomy EXAM: LUMBAR SPINE - 1 VIEW COMPARISON:  MRI lumbar spine dated 12/24/2019 FINDINGS: Single lateral view of the lumbar spine demonstrates a needle at the L3-4 level, posterior to the superior endplate of the L4 vertebral body. IMPRESSION: Lumbar localization as above. Electronically Signed   By: Charline Bills M.D.    On: 01/21/2020 10:05    Assessment & Plan:   Encounter for general adult medical examination with abnormal findings- Exam completed, [facility]/no additional labs done at his request, vaccines are up-to-date, no cancer screenings indicated, patient education was given.  Atherosclerosis of aorta (HCC)- Risk factor modifications addressed. -     Irbesartan; Take 1 tablet (150 mg total) by mouth daily.  Dispense: 90 tablet; Refill: 1 -     Rosuvastatin Calcium; Take 1 tablet (10 mg total) by mouth daily.  Dispense: 90 tablet; Refill: 1  Essential hypertension- His blood pressure is adequately well-controlled. -     Irbesartan; Take 1 tablet (150 mg total) by mouth daily.  Dispense: 90 tablet; Refill: 1  Hyperlipidemia with target LDL less than 130- LDL goal achieved. Doing well on the statin  -     Rosuvastatin Calcium; Take 1 tablet (10 mg total) by mouth daily.  Dispense: 90 tablet; Refill: 1     Follow-up: Return in about 6 months (around 03/20/2023).  Sanda Linger, MD

## 2022-09-18 NOTE — Telephone Encounter (Signed)
Pt scheduled for today 4/22 @ 2.20pm for follow up as he is overdue per last AVS.

## 2022-09-18 NOTE — Telephone Encounter (Signed)
Pt has called requesting following meds, pt states he took his last pills today.  rosuvastatin (CRESTOR) 10 MG tablet    irbesartan (AVAPRO) 150 MG tablet

## 2022-09-22 ENCOUNTER — Telehealth: Payer: Self-pay

## 2022-09-22 NOTE — Telephone Encounter (Signed)
Pt brought in paperwork that were labs from the [place] to his visit on 09/18/2022. Pt states he noticed he was not given back those labs with his discharged papers and is needing the labs back for his records. Please contact pt when the papers are ready for pick up as he just wanted a copy yo be in our system.

## 2022-09-25 NOTE — Telephone Encounter (Signed)
They were scanned

## 2022-09-25 NOTE — Telephone Encounter (Signed)
Pt has been informed that papers were sent to scan.   I recommended that he follow up with [place] in a week or so. Then I can print out a copy to mail to him once it has been scanned in.

## 2023-03-06 ENCOUNTER — Encounter: Payer: Self-pay | Admitting: Internal Medicine

## 2023-03-06 ENCOUNTER — Ambulatory Visit (INDEPENDENT_AMBULATORY_CARE_PROVIDER_SITE_OTHER): Payer: PPO | Admitting: Internal Medicine

## 2023-03-06 VITALS — BP 136/80 | HR 79 | Temp 98.2°F | Ht 66.0 in | Wt 199.0 lb

## 2023-03-06 DIAGNOSIS — Z23 Encounter for immunization: Secondary | ICD-10-CM | POA: Diagnosis not present

## 2023-03-06 DIAGNOSIS — I10 Essential (primary) hypertension: Secondary | ICD-10-CM | POA: Diagnosis not present

## 2023-03-06 DIAGNOSIS — I700 Atherosclerosis of aorta: Secondary | ICD-10-CM

## 2023-03-06 DIAGNOSIS — E785 Hyperlipidemia, unspecified: Secondary | ICD-10-CM | POA: Diagnosis not present

## 2023-03-06 LAB — LIPID PANEL
Cholesterol: 150 mg/dL (ref 0–200)
HDL: 63.8 mg/dL (ref >39.00)
LDL Cholesterol: 64 mg/dL (ref 0–99)
NonHDL: 86.25
Total CHOL/HDL Ratio: 2
Triglycerides: 113 mg/dL (ref 0.0–149.0)
VLDL: 22.6 mg/dL (ref 0.0–40.0)

## 2023-03-06 LAB — CBC WITH DIFFERENTIAL/PLATELET
Basophils Absolute: 0.1 10*3/uL (ref 0.0–0.1)
Basophils Relative: 0.7 % (ref 0.0–3.0)
Eosinophils Absolute: 0 10*3/uL (ref 0.0–0.7)
Eosinophils Relative: 0.3 % (ref 0.0–5.0)
HCT: 44.7 % (ref 39.0–52.0)
Hemoglobin: 14.6 g/dL (ref 13.0–17.0)
Lymphocytes Relative: 28.3 % (ref 12.0–46.0)
Lymphs Abs: 2.3 10*3/uL (ref 0.7–4.0)
MCHC: 32.5 g/dL (ref 30.0–36.0)
MCV: 93.6 fL (ref 78.0–100.0)
Monocytes Absolute: 0.9 10*3/uL (ref 0.1–1.0)
Monocytes Relative: 10.7 % (ref 3.0–12.0)
Neutro Abs: 4.8 10*3/uL (ref 1.4–7.7)
Neutrophils Relative %: 60 % (ref 43.0–77.0)
Platelets: 222 10*3/uL (ref 150.0–400.0)
RBC: 4.78 Mil/uL (ref 4.22–5.81)
RDW: 14 % (ref 11.5–15.5)
WBC: 8 10*3/uL (ref 4.0–10.5)

## 2023-03-06 LAB — URINALYSIS, ROUTINE W REFLEX MICROSCOPIC
Bilirubin Urine: NEGATIVE
Hgb urine dipstick: NEGATIVE
Ketones, ur: NEGATIVE
Leukocytes,Ua: NEGATIVE
Nitrite: NEGATIVE
RBC / HPF: NONE SEEN (ref 0–?)
Specific Gravity, Urine: 1.01 (ref 1.000–1.030)
Total Protein, Urine: NEGATIVE
Urine Glucose: NEGATIVE
Urobilinogen, UA: 0.2 (ref 0.0–1.0)
WBC, UA: NONE SEEN (ref 0–?)
pH: 7 (ref 5.0–8.0)

## 2023-03-06 LAB — HEPATIC FUNCTION PANEL
ALT: 21 U/L (ref 0–53)
AST: 22 U/L (ref 0–37)
Albumin: 4.2 g/dL (ref 3.5–5.2)
Alkaline Phosphatase: 66 U/L (ref 39–117)
Bilirubin, Direct: 0.2 mg/dL (ref 0.0–0.3)
Total Bilirubin: 1.2 mg/dL (ref 0.2–1.2)
Total Protein: 7.2 g/dL (ref 6.0–8.3)

## 2023-03-06 LAB — TSH: TSH: 1.01 u[IU]/mL (ref 0.35–5.50)

## 2023-03-06 LAB — BASIC METABOLIC PANEL
BUN: 12 mg/dL (ref 6–23)
CO2: 28 meq/L (ref 19–32)
Calcium: 9.4 mg/dL (ref 8.4–10.5)
Chloride: 101 meq/L (ref 96–112)
Creatinine, Ser: 0.91 mg/dL (ref 0.40–1.50)
GFR: 76.54 mL/min (ref >60.00)
Glucose, Bld: 97 mg/dL (ref 70–99)
Potassium: 5 meq/L (ref 3.5–5.1)
Sodium: 137 meq/L (ref 135–145)

## 2023-03-06 MED ORDER — IRBESARTAN 150 MG PO TABS
150.0000 mg | ORAL_TABLET | Freq: Every day | ORAL | 1 refills | Status: DC
Start: 2023-03-06 — End: 2023-09-05

## 2023-03-06 MED ORDER — ROSUVASTATIN CALCIUM 10 MG PO TABS
10.0000 mg | ORAL_TABLET | Freq: Every day | ORAL | 1 refills | Status: DC
Start: 2023-03-06 — End: 2023-09-05

## 2023-03-06 NOTE — Patient Instructions (Signed)
Hypertension, Adult High blood pressure (hypertension) is when the force of blood pumping through the arteries is too strong. The arteries are the blood vessels that carry blood from the heart throughout the body. Hypertension forces the heart to work harder to pump blood and may cause arteries to become narrow or stiff. Untreated or uncontrolled hypertension can lead to a heart attack, heart failure, a stroke, kidney disease, and other problems. A blood pressure reading consists of a higher number over a lower number. Ideally, your blood pressure should be below 120/80. The first ("top") number is called the systolic pressure. It is a measure of the pressure in your arteries as your heart beats. The second ("bottom") number is called the diastolic pressure. It is a measure of the pressure in your arteries as the heart relaxes. What are the causes? The exact cause of this condition is not known. There are some conditions that result in high blood pressure. What increases the risk? Certain factors may make you more likely to develop high blood pressure. Some of these risk factors are under your control, including: Smoking. Not getting enough exercise or physical activity. Being overweight. Having too much fat, sugar, calories, or salt (sodium) in your diet. Drinking too much alcohol. Other risk factors include: Having a personal history of heart disease, diabetes, high cholesterol, or kidney disease. Stress. Having a family history of high blood pressure and high cholesterol. Having obstructive sleep apnea. Age. The risk increases with age. What are the signs or symptoms? High blood pressure may not cause symptoms. Very high blood pressure (hypertensive crisis) may cause: Headache. Fast or irregular heartbeats (palpitations). Shortness of breath. Nosebleed. Nausea and vomiting. Vision changes. Severe chest pain, dizziness, and seizures. How is this diagnosed? This condition is diagnosed by  measuring your blood pressure while you are seated, with your arm resting on a flat surface, your legs uncrossed, and your feet flat on the floor. The cuff of the blood pressure monitor will be placed directly against the skin of your upper arm at the level of your heart. Blood pressure should be measured at least twice using the same arm. Certain conditions can cause a difference in blood pressure between your right and left arms. If you have a high blood pressure reading during one visit or you have normal blood pressure with other risk factors, you may be asked to: Return on a different day to have your blood pressure checked again. Monitor your blood pressure at home for 1 week or longer. If you are diagnosed with hypertension, you may have other blood or imaging tests to help your health care provider understand your overall risk for other conditions. How is this treated? This condition is treated by making healthy lifestyle changes, such as eating healthy foods, exercising more, and reducing your alcohol intake. You may be referred for counseling on a healthy diet and physical activity. Your health care provider may prescribe medicine if lifestyle changes are not enough to get your blood pressure under control and if: Your systolic blood pressure is above 130. Your diastolic blood pressure is above 80. Your personal target blood pressure may vary depending on your medical conditions, your age, and other factors. Follow these instructions at home: Eating and drinking  Eat a diet that is high in fiber and potassium, and low in sodium, added sugar, and fat. An example of this eating plan is called the DASH diet. DASH stands for Dietary Approaches to Stop Hypertension. To eat this way: Eat   plenty of fresh fruits and vegetables. Try to fill one half of your plate at each meal with fruits and vegetables. Eat whole grains, such as whole-wheat pasta, brown rice, or whole-grain bread. Fill about one  fourth of your plate with whole grains. Eat or drink low-fat dairy products, such as skim milk or low-fat yogurt. Avoid fatty cuts of meat, processed or cured meats, and poultry with skin. Fill about one fourth of your plate with lean proteins, such as fish, chicken without skin, beans, eggs, or tofu. Avoid pre-made and processed foods. These tend to be higher in sodium, added sugar, and fat. Reduce your daily sodium intake. Many people with hypertension should eat less than 1,500 mg of sodium a day. Do not drink alcohol if: Your health care provider tells you not to drink. You are pregnant, may be pregnant, or are planning to become pregnant. If you drink alcohol: Limit how much you have to: 0-1 drink a day for women. 0-2 drinks a day for men. Know how much alcohol is in your drink. In the [place] one drink equals one 12 oz bottle of beer (355 mL), one 5 oz glass of wine (148 mL), or one 1 oz glass of hard liquor (44 mL). Lifestyle  Work with your health care provider to maintain a healthy body weight or to lose weight. Ask what an ideal weight is for you. Get at least 30 minutes of exercise that causes your heart to beat faster (aerobic exercise) most days of the week. Activities may include walking, swimming, or biking. Include exercise to strengthen your muscles (resistance exercise), such as Pilates or lifting weights, as part of your weekly exercise routine. Try to do these types of exercises for 30 minutes at least 3 days a week. Do not use any products that contain nicotine or tobacco. These products include cigarettes, chewing tobacco, and vaping devices, such as e-cigarettes. If you need help quitting, ask your health care provider. Monitor your blood pressure at home as told by your health care provider. Keep all follow-up visits. This is important. Medicines Take over-the-counter and prescription medicines only as told by your health care provider. Follow directions carefully. Blood  pressure medicines must be taken as prescribed. Do not skip doses of blood pressure medicine. Doing this puts you at risk for problems and can make the medicine less effective. Ask your health care provider about side effects or reactions to medicines that you should watch for. Contact a health care provider if you: Think you are having a reaction to a medicine you are taking. Have headaches that keep coming back (recurring). Feel dizzy. Have swelling in your ankles. Have trouble with your vision. Get help right away if you: Develop a severe headache or confusion. Have unusual weakness or numbness. Feel faint. Have severe pain in your chest or abdomen. Vomit repeatedly. Have trouble breathing. These symptoms may be an emergency. Get help right away. Call 911. Do not wait to see if the symptoms will go away. Do not drive yourself to the hospital. Summary Hypertension is when the force of blood pumping through your arteries is too strong. If this condition is not controlled, it may put you at risk for serious complications. Your personal target blood pressure may vary depending on your medical conditions, your age, and other factors. For most people, a normal blood pressure is less than 120/80. Hypertension is treated with lifestyle changes, medicines, or a combination of both. Lifestyle changes include losing weight, eating a healthy,   low-sodium diet, exercising more, and limiting alcohol. This information is not intended to replace advice given to you by your health care provider. Make sure you discuss any questions you have with your health care provider. Document Revised: 03/22/2021 Document Reviewed: 03/22/2021 Elsevier Patient Education  2024 [employer] Inc[employer]  
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
## 2023-07-04 DIAGNOSIS — L812 Freckles: Secondary | ICD-10-CM | POA: Diagnosis not present

## 2023-07-04 DIAGNOSIS — L570 Actinic keratosis: Secondary | ICD-10-CM | POA: Diagnosis not present

## 2023-07-04 DIAGNOSIS — C44622 Squamous cell carcinoma of skin of right upper limb, including shoulder: Secondary | ICD-10-CM | POA: Diagnosis not present

## 2023-07-04 DIAGNOSIS — L821 Other seborrheic keratosis: Secondary | ICD-10-CM | POA: Diagnosis not present

## 2023-07-04 DIAGNOSIS — Z85828 Personal history of other malignant neoplasm of skin: Secondary | ICD-10-CM | POA: Diagnosis not present

## 2023-07-04 DIAGNOSIS — D485 Neoplasm of uncertain behavior of skin: Secondary | ICD-10-CM | POA: Diagnosis not present

## 2023-08-22 LAB — HEMOGLOBIN A1C: Hemoglobin A1C: 5.3

## 2023-08-22 LAB — BASIC METABOLIC PANEL WITH GFR
BUN: 15 (ref 4–21)
CO2: 30 — AB (ref 13–22)
Chloride: 104 (ref 99–108)
Creatinine: 1 (ref 0.6–1.3)
Glucose: 110
Potassium: 5.2 meq/L — AB (ref 3.5–5.1)
Sodium: 138 (ref 137–147)

## 2023-08-22 LAB — CBC AND DIFFERENTIAL
HCT: 44 (ref 41–53)
Hemoglobin: 15 (ref 13.5–17.5)
WBC: 7

## 2023-08-22 LAB — LIPID PANEL
Cholesterol: 140 (ref 0–200)
HDL: 62 (ref 35–70)
LDL Cholesterol: 71
Triglycerides: 69 (ref 40–160)

## 2023-08-22 LAB — COMPREHENSIVE METABOLIC PANEL WITH GFR: Calcium: 9.4 (ref 8.7–10.7)

## 2023-08-22 LAB — MICROALBUMIN, URINE: Microalb, Ur: 0.9

## 2023-09-05 ENCOUNTER — Other Ambulatory Visit: Payer: Self-pay | Admitting: Internal Medicine

## 2023-09-05 DIAGNOSIS — I10 Essential (primary) hypertension: Secondary | ICD-10-CM

## 2023-09-05 DIAGNOSIS — I700 Atherosclerosis of aorta: Secondary | ICD-10-CM

## 2023-09-05 DIAGNOSIS — E785 Hyperlipidemia, unspecified: Secondary | ICD-10-CM

## 2023-09-28 ENCOUNTER — Other Ambulatory Visit: Payer: Self-pay | Admitting: Internal Medicine

## 2023-09-28 DIAGNOSIS — I10 Essential (primary) hypertension: Secondary | ICD-10-CM

## 2023-09-28 DIAGNOSIS — E785 Hyperlipidemia, unspecified: Secondary | ICD-10-CM

## 2023-09-28 DIAGNOSIS — I700 Atherosclerosis of aorta: Secondary | ICD-10-CM

## 2023-09-28 NOTE — Telephone Encounter (Unsigned)
 Copied from CRM [id number]. Topic: Clinical - Medication Refill >> Sep 28, 2023  8:45 AM Annelle Kiel wrote: Most Recent Primary Care Visit:  Provider: Arcadio Knuckles  Department: [facility]  Visit Type: OFFICE VISIT  Date: 03/06/2023  Medication: irbesartan  (AVAPRO ) 150 MG tablet [rosuvastatin  (CRESTOR ) 10 MG tablet [[id number]]  Has the patient contacted their pharmacy? Yes (Agent: If no, request that the patient contact the pharmacy for the refill. If patient does not wish to contact the pharmacy document the reason why and proceed with request.) (Agent: If yes, when and what did the pharmacy advise?)  Is this the correct pharmacy for this prescription? Yes If no, delete pharmacy and type the correct one.  This is the patient's preferred pharmacy:  [id number] - [place], [place] - [address] AT [address] OF [facility] [address] [place] [address] Phone: [phone] Fax: [phone]   Has the prescription been filled recently? No  Is the patient out of the medication? Yes  Has the patient been seen for an appointment in the last year OR does the patient have an upcoming appointment? Yes  Can we respond through MyChart? No  Agent: Please be advised that Rx refills may take up to 3 business days. We ask that you follow-up with your pharmacy.

## 2023-10-25 ENCOUNTER — Encounter: Payer: Self-pay | Admitting: Internal Medicine

## 2023-10-25 ENCOUNTER — Ambulatory Visit: Admitting: Internal Medicine

## 2023-10-25 VITALS — BP 130/72 | HR 76 | Temp 97.8°F | Ht 66.0 in | Wt 199.2 lb

## 2023-10-25 DIAGNOSIS — Z789 Other specified health status: Secondary | ICD-10-CM | POA: Insufficient documentation

## 2023-10-25 DIAGNOSIS — E875 Hyperkalemia: Secondary | ICD-10-CM | POA: Diagnosis not present

## 2023-10-25 DIAGNOSIS — E785 Hyperlipidemia, unspecified: Secondary | ICD-10-CM | POA: Diagnosis not present

## 2023-10-25 DIAGNOSIS — I700 Atherosclerosis of aorta: Secondary | ICD-10-CM

## 2023-10-25 DIAGNOSIS — I10 Essential (primary) hypertension: Secondary | ICD-10-CM

## 2023-10-25 DIAGNOSIS — D099 Carcinoma in situ, unspecified: Secondary | ICD-10-CM | POA: Insufficient documentation

## 2023-10-25 MED ORDER — ROSUVASTATIN CALCIUM 10 MG PO TABS: 10.0000 mg | ORAL_TABLET | Freq: Every day | ORAL | 1 refills | Status: DC

## 2023-10-25 MED ORDER — IRBESARTAN 150 MG PO TABS
150.0000 mg | ORAL_TABLET | Freq: Every day | ORAL | 1 refills | Status: DC
Start: 2023-10-25 — End: 2024-04-17

## 2023-10-25 NOTE — Patient Instructions (Signed)
Hyperkalemia Hyperkalemia occurs when the level of potassium in the blood is too high. Potassium is an important mineral (electrolyte) that helps the muscles and nerves function normally. It affects how the heart works, and it helps keep fluids and minerals balanced in the body. If there is too much potassium in your blood, it can affect your heart's ability to function normally. Potassium is normally removed (excreted) from the body by the kidneys. Hyperkalemia can result from various conditions. It can range from mild to severe. What are the causes? This condition may be caused by: Taking in too much potassium. This can happen if: You use salt substitutes. They contain large amounts of potassium. You take potassium supplements. You eat too many foods that are high in potassium if you have kidney disease. Excreting too little potassium. This can happen if: Your kidneys are not working properly. Kidney (renal) disease, including short-term or long-term renal failure, is a common cause of hyperkalemia. You are taking medicines that lower your excretion of potassium, such as ACE inhibitors, angiotensin II receptor blockers (ARBs), or potassium-sparing diuretics, such as spironolactone. You have Addison's disease. You have a urinary tract blockage, such as kidney stones. You are on treatment to mechanically clean your blood (dialysis) and you skip a treatment. Your cells releasing a high amount of potassium into the blood. This can happen with: Injury to muscles (rhabdomyolysis) or other tissues. Most potassium is stored in your muscles. Severe burns, injuries, or infections. Acidic blood plasma (acidosis). Acidosis can result from many diseases, such as uncontrolled diabetes. What increases the risk? You are more likely to develop this condition if you have alcoholism or if you use drugs heavily. What are the signs or symptoms? In many cases, there are no symptoms. However, when your potassium  level becomes high enough, you may have symptoms such as: An irregular or very slow heartbeat. Nausea. Tiredness (fatigue). Confusion. Tingling of your skin or numbness of your hands or feet. Muscle cramps. Muscle weakness. Not being able to move (paralysis). How is this diagnosed? This condition may be diagnosed based on: Your symptoms and medical history. Your health care provider will ask about your use of over-the-counter and prescription medicines. A physical exam. Blood tests. An electrocardiogram (ECG). How is this treated? Treatment depends on the cause and severity of your condition. Treatment may need to be done in the hospital setting. Treatment may include: Receiving a sugar solution (glucose) through an IV along with insulin to shift potassium out of your blood and into your cells. Taking a medicine called albuterol to shift potassium out of your blood and into your cells. Taking medicines to remove the potassium from your body. Having dialysis to remove the potassium from your body. Taking calcium to protect your heart from the effects of high potassium, such as irregular rhythms (arrhythmias). Follow these instructions at home:  Take over-the-counter and prescription medicines only as told by your health care provider. Do not take any supplements, natural food products, herbs, or vitamins without reviewing them with your health care provider. Certain supplements and natural food products contain high amounts of potassium. If you drink alcohol, limit how much you have as told by your health care provider. Do not use illegal drugs. If you need help quitting, ask your health care provider. If you have kidney disease, you may need to follow a low-potassium diet. A dietitian can help you learn which foods have high or low amounts of potassium. Keep all follow-up visits. This is important.  Contact a health care provider if: You have an irregular or very slow heartbeat. You  feel light-headed. You feel weak. You are nauseous. You have tingling or numbness in your hands or feet. Get help right away if: You have shortness of breath. You have chest pain or discomfort. You faint. You have muscle paralysis. These symptoms may be an emergency. Get help right away. Call 911. Do not wait to see if the symptoms will go away. Do not drive yourself to the hospital. Summary Hyperkalemia occurs when the level of potassium in your blood is too high. This condition may be caused by taking in too much potassium, excreting too little potassium, or releasing a high amount of potassium from your cells into your blood. Hyperkalemia can result from many underlying conditions, especially chronic kidney disease, or from taking certain medicines. Treatment of hyperkalemia may include medicine to shift potassium out of your blood and into your cells or to remove the potassium from your body. If you have kidney disease, you may need to follow a low-potassium diet. A dietitian can help you learn which foods have high or low amounts of potassium. This information is not intended to replace advice given to you by your health care provider. Make sure you discuss any questions you have with your health care provider. Document Revised: 01/27/2021 Document Reviewed: 01/27/2021 Elsevier Patient Education  2024 [employer].

## 2023-10-25 NOTE — Progress Notes (Unsigned)
 Subjective:  Patient ID: Christopher Dudley, male    DOB: Oct 12, 1934  Age: 89 y.o. MRN: [id number]  CC: Hypertension and Hyperlipidemia   HPI MAKANI SECKMAN presents for f/up ---  Discussed the use of AI scribe software for clinical note transcription with the patient, who gave verbal consent to proceed.  History of Present Illness   Christopher Dudley "Georgette Kins" is an 89 year old male who presents for follow-up regarding high potassium levels.  Recent lab work from March at the [facility] indicated a slightly elevated potassium level of 5.2 mmol/L. He attributes this to his dietary habits, specifically consuming one or two bananas at lunch and either a sweet potato or russet potato at dinner. He has since adjusted his diet accordingly.  He monitors his blood pressure at home, with a reading of 117/71 mmHg at 11 AM on the day of the visit. He notes that his blood pressure is higher in the office.  He remains active and denies DOE, CP, edema.       Outpatient Medications Prior to Visit  Medication Sig Dispense Refill   irbesartan  (AVAPRO ) 150 MG tablet TAKE 1 TABLET (150 MG TOTAL) BY MOUTH DAILY. SCHEDULE AN APPT FOR FURTHER REFILLS 30 tablet 0   rosuvastatin  (CRESTOR ) 10 MG tablet TAKE 1 TABLET BY MOUTH DAILY *SCHEDULE APPOINTMENT FOR REFILLS* 30 tablet 0   Multiple Vitamin (MULTIVITAMIN WITH MINERALS) TABS tablet Take 1 tablet by mouth daily.      No facility-administered medications prior to visit.    ROS Review of Systems  Objective:  BP 130/72 (BP Location: Left Arm, Patient Position: Sitting, Cuff Size: Normal)   Pulse 76   Temp 97.8 F (36.6 C) (Oral)   Ht 5\' 6"  (1.676 m)   Wt 199 lb 3.2 oz (90.4 kg)   SpO2 97%   BMI 32.15 kg/m   BP Readings from Last 3 Encounters:  10/25/23 130/72  03/06/23 136/80  09/18/22 138/76    Wt Readings from Last 3 Encounters:  10/25/23 199 lb 3.2 oz (90.4 kg)  03/06/23 199 lb (90.3 kg)  09/18/22 194 lb (88 kg)    Physical Exam Vitals  reviewed.  Constitutional:      Appearance: Normal appearance.  HENT:     Nose: Nose normal.     Mouth/Throat:     Mouth: Mucous membranes are moist.  Eyes:     General: No scleral icterus.    Conjunctiva/sclera: Conjunctivae normal.  Cardiovascular:     Rate and Rhythm: Normal rate and regular rhythm.     Heart sounds: No murmur heard.    No friction rub. No gallop.     Comments: EKG- NSR, 70 bpm No LVH, Q waves, or ST/T wave changes  Unchanged  Pulmonary:     Effort: Pulmonary effort is normal.     Breath sounds: No stridor. No wheezing, rhonchi or rales.  Abdominal:     General: Abdomen is flat.     Palpations: There is no mass.     Tenderness: There is no abdominal tenderness. There is no guarding.     Hernia: No hernia is present.  Musculoskeletal:        General: Normal range of motion.     Cervical back: Neck supple.     Right lower leg: No edema.     Left lower leg: No edema.  Lymphadenopathy:     Cervical: No cervical adenopathy.  Skin:    General: Skin is warm and dry.  Neurological:     General: No focal deficit present.     Mental Status: He is alert. Mental status is at baseline.  Psychiatric:        Mood and Affect: Mood normal.        Behavior: Behavior normal.     Lab Results  Component Value Date   WBC 7.0 08/22/2023   HGB 15.0 08/22/2023   HCT 44 08/22/2023   PLT 222.0 03/06/2023   GLUCOSE 97 03/06/2023   CHOL 140 08/22/2023   TRIG 69 08/22/2023   HDL 62 08/22/2023   LDLCALC 71 08/22/2023   ALT 21 03/06/2023   AST 22 03/06/2023   NA 138 08/22/2023   K 5.2 (A) 08/22/2023   CL 104 08/22/2023   CREATININE 1.0 08/22/2023   BUN 15 08/22/2023   CO2 30 (A) 08/22/2023   TSH 1.01 03/06/2023   PSA 4.27 01/24/2019   HGBA1C 5.3 08/22/2023   MICROALBUR 0.9 08/22/2023    DG Lumbar Spine 1 View Result Date: 01/21/2020 CLINICAL DATA:  L3-5 laminectomy EXAM: LUMBAR SPINE - 1 VIEW COMPARISON:  MRI lumbar spine dated 12/24/2019 FINDINGS: Single  lateral view of the lumbar spine demonstrates a needle at the L3-4 level, posterior to the superior endplate of the L4 vertebral body. IMPRESSION: Lumbar localization as above. Electronically Signed   By: Zadie Herter M.D.   On: 01/21/2020 10:05    Assessment & Plan:  Essential hypertension -     Basic metabolic panel with GFR; Future -     Irbesartan ; Take 1 tablet (150 mg total) by mouth daily. Schedule an appt for further refills  Dispense: 90 tablet; Refill: 1  Chronic hyperkalemia -     Basic metabolic panel with GFR; Future -     EKG 12-Lead  Atherosclerosis of aorta (HCC) -     Irbesartan ; Take 1 tablet (150 mg total) by mouth daily. Schedule an appt for further refills  Dispense: 90 tablet; Refill: 1 -     Rosuvastatin  Calcium ; Take 1 tablet (10 mg total) by mouth daily.  Dispense: 90 tablet; Refill: 1  Hyperlipidemia with target LDL less than 130 -     Rosuvastatin  Calcium ; Take 1 tablet (10 mg total) by mouth daily.  Dispense: 90 tablet; Refill: 1     Follow-up: Return in about 6 months (around 04/26/2024).  Sandra Crouch, MD

## 2024-01-04 ENCOUNTER — Ambulatory Visit (INDEPENDENT_AMBULATORY_CARE_PROVIDER_SITE_OTHER)

## 2024-01-04 VITALS — Ht 66.0 in | Wt 199.0 lb

## 2024-01-04 DIAGNOSIS — Z0000 Encounter for general adult medical examination without abnormal findings: Secondary | ICD-10-CM | POA: Diagnosis not present

## 2024-01-04 NOTE — Patient Instructions (Addendum)
 Christopher Dudley , Thank you for taking time out of your busy schedule to complete your Annual Wellness Visit with me. I enjoyed our conversation and look forward to speaking with you again next year. I, as well as your care team,  appreciate your ongoing commitment to your health goals. Please review the following plan we discussed and let me know if I can assist you in the future. Your Game plan/ To Do List    Referrals: If you haven't heard from the office you've been referred to, please reach out to them at the phone provided.   Follow up Visits: We will see or speak with you next year for your Next Medicare AWV with our clinical staff Have you seen your provider in the last 6 months (3 months if uncontrolled diabetes)? Yes  Clinician Recommendations:  Aim for 30 minutes of exercise or brisk walking, 6-8 glasses of water, and 5 servings of fruits and vegetables each day.       This is a list of the screenings recommended for you:  Health Maintenance  Topic Date Due   COVID-19 Vaccine (4 - 2024-25 season) 01/28/2023   Flu Shot  12/28/2023   Zoster (Shingles) Vaccine (1 of 2) 01/25/2024*   Medicare Annual Wellness Visit  01/03/2025   DTaP/Tdap/Td vaccine (4 - Td or Tdap) 02/23/2030   Pneumococcal Vaccine for age over 3  Completed   Hepatitis B Vaccine  Aged Out   HPV Vaccine  Aged Out   Meningitis B Vaccine  Aged Out  *Topic was postponed. The date shown is not the original due date.    Advanced directives: (Copy Requested) Please bring a copy of your health care power of attorney and living will to the office to be added to your chart at your convenience. You can mail to [facility] [address] [place], [place] [address] or email to [email or web address] Advance Care Planning is important because it:  [x]  Makes sure you receive the medical care that is consistent with your values, goals, and preferences  [x]  It provides guidance to your family and loved ones  and reduces their decisional burden about whether or not they are making the right decisions based on your wishes.  Follow the link provided in your after visit summary or read over the paperwork we have mailed to you to help you started getting your Advance Directives in place. If you need assistance in completing these, please reach out to us  so that we can help you!

## 2024-01-04 NOTE — Progress Notes (Signed)
 Subjective:  Please attest and cosign this visit due to patients primary care provider not being in the office at the time the visit was completed.  (Pt of Dr Debby Molt)   Christopher Dudley is a 89 y.o. who presents for a Medicare Wellness preventive visit.  As a reminder, Annual Wellness Visits don't include a physical exam, and some assessments may be limited, especially if this visit is performed virtually. We may recommend an in-person follow-up visit with your provider if needed.  Visit Complete: Virtual I connected with  [name] on 01/04/24 by a audio enabled telemedicine application and verified that I am speaking with the correct person using two identifiers.  Patient Location: Home  Provider Location: Office/Clinic  I discussed the limitations of evaluation and management by telemedicine. The patient expressed understanding and agreed to proceed.  Vital Signs: Because this visit was a virtual/telehealth visit, some criteria may be missing or patient reported. Any vitals not documented were not able to be obtained and vitals that have been documented are patient reported.  VideoDeclined- This patient declined Librarian, academic. Therefore the visit was completed with audio only.  Persons Participating in Visit: Patient.  AWV Questionnaire: No: Patient Medicare AWV questionnaire was not completed prior to this visit.  Cardiac Risk Factors include: advanced age (>69men, >54 women);male gender;hypertension;obesity (BMI >30kg/m2)     Objective:    Today's Vitals   01/04/24 0853  Weight: 199 lb (90.3 kg)  Height: 5' 6 (1.676 m)   Body mass index is 32.12 kg/m.     01/04/2024    8:53 AM 03/03/2022    9:19 AM 01/19/2020   11:06 AM 01/26/2017   10:03 AM 01/11/2015   11:50 AM 01/11/2015    8:57 AM  Advanced Directives  Does Patient Have a Medical Advance Directive? Yes Yes Yes Yes  Yes  Yes   Type of Engineer, mining of [place];Living will Healthcare Power of [place];Living will Living will Healthcare Power of [employer] Power of [place];Living will  Healthcare Power of [place];Living will   Does patient want to make changes to medical advance directive?    Yes (ED - Information included in AVS)  No - Patient declined  No - Patient declined   Copy of Healthcare Power of Attorney in Chart? No - copy requested No - copy requested  Yes   Yes      Data saved with a previous flowsheet row definition    Current Medications (verified) Outpatient Encounter Medications as of 01/04/2024  Medication Sig   irbesartan  (AVAPRO ) 150 MG tablet Take 1 tablet (150 mg total) by mouth daily. Schedule an appt for further refills   rosuvastatin  (CRESTOR ) 10 MG tablet Take 1 tablet (10 mg total) by mouth daily.   No facility-administered encounter medications on file as of 01/04/2024.    Allergies (verified) Patient has no known allergies.   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 ([place])   LIGAMENT REPAIR Right    right arm   LUMBAR LAMINECTOMY/DECOMPRESSION MICRODISCECTOMY Bilateral 01/21/2020  Procedure: LAMINECTOMY AND FORAMINOTOMY BILATERAL, LUMBAR THREE- LUMBAR FOUR, LUMBAR FOUR- LUMBAR FIVE;  Surgeon: Mavis Purchase, MD;  Location: [facility] OR;  Service: Neurosurgery;  Laterality: Bilateral;   pilondial cyst excision  89 yrs old   ROTATOR CUFF REPAIR  07   Family History  Problem Relation Age of Onset   Cancer Mother        ovarian   Coronary artery disease Father    Heart attack Father    Gallbladder disease Father    Hypertension Father    Cancer Father        prostate   Heart disease Father         CAD/MI-fatal   Diabetes Neg Hx    Social History   Socioeconomic History   Marital status: Widowed    Spouse name: Not on file   Number of children: Not on file   Years of education: 16   Highest education level: Bachelor's degree (e.g., BA, AB, BS)  Occupational History   Occupation: Loss adjuster, chartered, Set designer    Comment: retired  Tobacco Use   Smoking status: Never   Smokeless tobacco: Never  Substance and Sexual Activity   Alcohol use: Yes    Alcohol/week: 1.0 standard drink of alcohol    Types: 1 Glasses of wine per week    Comment: with dinner   Drug use: No   Sexual activity: Not Currently  Other Topics Concern   Not on file  Social History Narrative   [place] BS degree.  [employer] x 4 years.  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 married '64 - 1 year divorce; married '70.  1 son - premature, perinatal death.  Wife with progressive dementia/confusion. Also s/p beast cancer '07. Retired. 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 Hobby - golf. Plays the market-doing well. ACP: CPR-yes; mechanical ventilation - yes, for short term; no futile care for vegative state or irreversible disease.    Social Drivers of Corporate investment banker Strain: Low Risk  (01/04/2024)   Overall Financial Resource Strain (CARDIA)    Difficulty of Paying Living Expenses: Not hard at all  Food Insecurity: No Food Insecurity (01/04/2024)   Hunger Vital Sign    Worried About Running Out of Food in the Last Year: Never true    Ran Out of Food in the Last Year: Never true  Transportation Needs: No Transportation Needs (01/04/2024)   PRAPARE - Administrator, Civil Service (Medical): No    Lack of Transportation (Non-Medical): No  Physical Activity: Sufficiently Active (01/04/2024)   Exercise Vital Sign    Days of Exercise per Week: 4 days    Minutes of Exercise per Session: 60 min  Recent Concern: Physical Activity - Insufficiently Active (10/18/2023)   Exercise Vital Sign     Days of Exercise per Week: 3 days    Minutes of Exercise per Session: 30 min  Stress: No Stress Concern Present (01/04/2024)   [employer] of Occupational Health - Occupational Stress Questionnaire    Feeling of Stress: Not at all  Social Connections: Moderately Isolated (01/04/2024)   Social Connection and Isolation Panel    Frequency of Communication with Friends and Family: More than three times a week    Frequency of Social Gatherings with Friends and Family: Twice a week    Attends Religious Services: More than 4 times per year    Active Member of [employer] or Organizations: No    Attends Banker Meetings: Never  Marital Status: Widowed    Tobacco Counseling Counseling given: No    Clinical Intake:  Pre-visit preparation completed: Yes  Pain : No/denies pain     BMI - recorded: 32.12 Nutritional Risks: None Diabetes: No  Lab Results  Component Value Date   HGBA1C 5.3 08/22/2023     How often do you need to have someone help you when you read instructions, pamphlets, or other written materials from your doctor or pharmacy?: 1 - Never  Interpreter Needed?: No  Information entered by :: Verdie Saba, CMA   Activities of Daily Living     01/04/2024    8:56 AM  In your present state of health, do you have any difficulty performing the following activities:  Hearing? 0  Vision? 0  Difficulty concentrating or making decisions? 0  Walking or climbing stairs? 0  Dressing or bathing? 0  Doing errands, shopping? 0  Preparing Food and eating ? N  Using the Toilet? N  In the past six months, have you accidently leaked urine? N  Do you have problems with loss of bowel control? N  Managing your Medications? N  Managing your Finances? N  Housekeeping or managing your Housekeeping? N    Patient Care Team: Joshua Debby CROME, MD as PCP - General (Internal Medicine) Dwane Lerner, MD (Dermatology)  I have updated your Care Teams any recent Medical  Services you may have received from other providers in the past year.     Assessment:   This is a routine wellness examination for [place].  Hearing/Vision screen Hearing Screening - Comments:: Denies hearing difficulties   Vision Screening - Comments:: Wears eyeglasses for reading - plans to get another Ophthalmologist    Goals Addressed               This Visit's Progress     Patient Stated (pt-stated)        Patient stated he plans to continue to live a positive and active life       Depression Screen     01/04/2024    8:59 AM 10/25/2023    1:08 PM 03/06/2023   10:00 AM 03/03/2022    9:18 AM 12/12/2021    2:35 PM 02/24/2020    1:46 PM 02/05/2019    1:16 PM  PHQ 2/9 Scores  PHQ - 2 Score 0 0 0 0 0 0 0  PHQ- 9 Score 0  0  0      Fall Risk     01/04/2024    8:56 AM 10/25/2023    1:08 PM 03/06/2023   10:00 AM 03/03/2022    9:17 AM 12/12/2021    2:35 PM  Fall Risk   Falls in the past year? 0 0 0 0 0  Number falls in past yr: 0 0 0 0   Injury with Fall? 0 0 0 0   Risk for fall due to : No Fall Risks No Fall Risks No Fall Risks No Fall Risks   Follow up Falls evaluation completed;Falls prevention discussed Falls evaluation completed Falls evaluation completed Falls prevention discussed       Data saved with a previous flowsheet row definition    MEDICARE RISK AT HOME:  Medicare Risk at Home Any stairs in or around the home?: No If so, are there any without handrails?: No Home free of loose throw rugs in walkways, pet beds, electrical cords, etc?: Yes Adequate lighting in your home to reduce risk of falls?: Yes Life alert?: No  Use of a cane, walker or w/c?: No Grab bars in the bathroom?: Yes Shower chair or bench in shower?: Yes Elevated toilet seat or a handicapped toilet?: Yes  TIMED UP AND GO:  Was the test performed?  No  Cognitive Function: 6CIT completed        01/04/2024    8:58 AM 03/03/2022    9:20 AM  6CIT Screen  What Year? 0 points 0 points  What  month? 0 points 0 points  What time? 0 points 0 points  Count back from 20 0 points 0 points  Months in reverse 0 points 0 points  Repeat phrase 0 points 0 points  Total Score 0 points 0 points    Immunizations Immunization History  Administered Date(s) Administered   Fluad Quad(high Dose 65+) 02/05/2019, 02/24/2020, 02/01/2021, 02/07/2022   Fluad Trivalent(High Dose 65+) 03/06/2023   Influenza Split 02/13/2011, 02/08/2012   Influenza Whole 02/21/2008, 03/11/2009, 02/18/2010   Influenza, High Dose Seasonal PF 04/07/2009, 02/27/2012, 01/27/2013, 02/26/2014, 01/28/2015, 02/01/2016, 02/18/2016, 01/24/2017, 01/30/2018   Influenza,inj,Quad PF,6+ Mos 02/06/2013, 02/10/2014   Influenza-Unspecified 02/27/2003, 03/29/2004, 02/26/2005, 01/27/2006, 02/27/2007, 03/29/2008, 02/26/2010, 02/27/2011, 01/28/2020, 04/04/2021   Moderna Sars-Covid-2 Vaccination 07/14/2019, 08/12/2019, 04/26/2020   Pneumococcal Conjugate-13 02/27/2012, 01/05/2014   Pneumococcal Polysaccharide-23 01/17/2016, 12/12/2021   Pneumococcal-Unspecified 01/27/2002, 11/01/2006   Td 09/24/2009   Td (Adult) 07/17/2018   Tdap 02/24/2020   Zoster, Live 01/17/2016    Screening Tests Health Maintenance  Topic Date Due   COVID-19 Vaccine (4 - 2024-25 season) 01/28/2023   INFLUENZA VACCINE  12/28/2023   Zoster Vaccines- Shingrix  (1 of 2) 01/25/2024 (Originally 01/31/1954)   Medicare Annual Wellness (AWV)  01/03/2025   DTaP/Tdap/Td (4 - Td or Tdap) 02/23/2030   Pneumococcal Vaccine: 50+ Years  Completed   Hepatitis B Vaccines  Aged Out   HPV VACCINES  Aged Out   Meningococcal B Vaccine  Aged Out    Health Maintenance  Health Maintenance Due  Topic Date Due   COVID-19 Vaccine (4 - 2024-25 season) 01/28/2023   INFLUENZA VACCINE  12/28/2023   Health Maintenance Items Addressed:01/04/2024   Additional Screening:  Vision Screening: Recommended annual ophthalmology exams for early detection of glaucoma and other disorders of the  eye. Would you like a referral to an eye doctor? No    Dental Screening: Recommended annual dental exams for proper oral hygiene  Community Resource Referral / Chronic Care Management: CRR required this visit?  No   CCM required this visit?  No   Plan:    I have personally reviewed and noted the following in the patient's chart:   Medical and social history Use of alcohol, tobacco or illicit drugs  Current medications and supplements including opioid prescriptions. Patient is not currently taking opioid prescriptions. Functional ability and status Nutritional status Physical activity Advanced directives List of other physicians Hospitalizations, surgeries, and ER visits in previous 12 months Vitals Screenings to include cognitive, depression, and falls Referrals and appointments  In addition, I have reviewed and discussed with patient certain preventive protocols, quality metrics, and best practice recommendations. A written personalized care plan for preventive services as well as general preventive health recommendations were provided to patient.   Verdie CHRISTELLA Saba, CMA   01/04/2024   After Visit Summary: (MyChart) Due to this being a telephonic visit, the after visit summary with patients personalized plan was offered to patient via MyChart   Notes: Nothing significant to report at this time.

## 2024-01-22 ENCOUNTER — Other Ambulatory Visit: Payer: Self-pay | Admitting: Internal Medicine

## 2024-01-22 DIAGNOSIS — E785 Hyperlipidemia, unspecified: Secondary | ICD-10-CM

## 2024-01-22 DIAGNOSIS — I700 Atherosclerosis of aorta: Secondary | ICD-10-CM

## 2024-04-17 ENCOUNTER — Other Ambulatory Visit: Payer: Self-pay | Admitting: Internal Medicine

## 2024-04-17 DIAGNOSIS — I700 Atherosclerosis of aorta: Secondary | ICD-10-CM

## 2024-04-17 DIAGNOSIS — I10 Essential (primary) hypertension: Secondary | ICD-10-CM

## 2024-04-28 ENCOUNTER — Ambulatory Visit: Admitting: Internal Medicine

## 2024-05-27 ENCOUNTER — Ambulatory Visit: Payer: Self-pay | Admitting: Internal Medicine

## 2024-05-27 ENCOUNTER — Encounter: Payer: Self-pay | Admitting: Internal Medicine

## 2024-05-27 ENCOUNTER — Ambulatory Visit (INDEPENDENT_AMBULATORY_CARE_PROVIDER_SITE_OTHER): Admitting: Internal Medicine

## 2024-05-27 VITALS — BP 122/64 | HR 84 | Temp 97.5°F | Resp 16 | Ht 66.0 in | Wt 179.8 lb

## 2024-05-27 DIAGNOSIS — E785 Hyperlipidemia, unspecified: Secondary | ICD-10-CM

## 2024-05-27 DIAGNOSIS — Z23 Encounter for immunization: Secondary | ICD-10-CM | POA: Diagnosis not present

## 2024-05-27 DIAGNOSIS — Z0001 Encounter for general adult medical examination with abnormal findings: Secondary | ICD-10-CM

## 2024-05-27 DIAGNOSIS — R350 Frequency of micturition: Secondary | ICD-10-CM | POA: Diagnosis not present

## 2024-05-27 DIAGNOSIS — I10 Essential (primary) hypertension: Secondary | ICD-10-CM

## 2024-05-27 DIAGNOSIS — I4819 Other persistent atrial fibrillation: Secondary | ICD-10-CM | POA: Insufficient documentation

## 2024-05-27 LAB — BASIC METABOLIC PANEL WITH GFR
BUN: 11 mg/dL (ref 6–23)
CO2: 29 meq/L (ref 19–32)
Calcium: 9.2 mg/dL (ref 8.4–10.5)
Chloride: 102 meq/L (ref 96–112)
Creatinine, Ser: 0.9 mg/dL (ref 0.40–1.50)
GFR: 76.89 mL/min
Glucose, Bld: 106 mg/dL — ABNORMAL HIGH (ref 70–99)
Potassium: 4.3 meq/L (ref 3.5–5.1)
Sodium: 138 meq/L (ref 135–145)

## 2024-05-27 LAB — CBC WITH DIFFERENTIAL/PLATELET
Basophils Absolute: 0.1 K/uL (ref 0.0–0.1)
Basophils Relative: 0.7 % (ref 0.0–3.0)
Eosinophils Absolute: 0 K/uL (ref 0.0–0.7)
Eosinophils Relative: 0.6 % (ref 0.0–5.0)
HCT: 44.6 % (ref 39.0–52.0)
Hemoglobin: 14.7 g/dL (ref 13.0–17.0)
Lymphocytes Relative: 34.1 % (ref 12.0–46.0)
Lymphs Abs: 2.6 K/uL (ref 0.7–4.0)
MCHC: 32.9 g/dL (ref 30.0–36.0)
MCV: 91.6 fl (ref 78.0–100.0)
Monocytes Absolute: 0.6 K/uL (ref 0.1–1.0)
Monocytes Relative: 7.9 % (ref 3.0–12.0)
Neutro Abs: 4.3 K/uL (ref 1.4–7.7)
Neutrophils Relative %: 56.7 % (ref 43.0–77.0)
Platelets: 245 K/uL (ref 150.0–400.0)
RBC: 4.87 Mil/uL (ref 4.22–5.81)
RDW: 13.1 % (ref 11.5–15.5)
WBC: 7.7 K/uL (ref 4.0–10.5)

## 2024-05-27 LAB — URINALYSIS, ROUTINE W REFLEX MICROSCOPIC
Bilirubin Urine: NEGATIVE
Hgb urine dipstick: NEGATIVE
Ketones, ur: NEGATIVE
Leukocytes,Ua: NEGATIVE
Nitrite: NEGATIVE
Specific Gravity, Urine: 1.01 (ref 1.000–1.030)
Total Protein, Urine: NEGATIVE
Urine Glucose: NEGATIVE
Urobilinogen, UA: 0.2 (ref 0.0–1.0)
pH: 7 (ref 5.0–8.0)

## 2024-05-27 LAB — TSH: TSH: 0.86 u[IU]/mL (ref 0.35–5.50)

## 2024-05-27 LAB — HEPATIC FUNCTION PANEL
ALT: 18 U/L (ref 3–53)
AST: 18 U/L (ref 5–37)
Albumin: 4.1 g/dL (ref 3.5–5.2)
Alkaline Phosphatase: 64 U/L (ref 39–117)
Bilirubin, Direct: 0.3 mg/dL (ref 0.1–0.3)
Total Bilirubin: 1.3 mg/dL — ABNORMAL HIGH (ref 0.2–1.2)
Total Protein: 7.2 g/dL (ref 6.0–8.3)

## 2024-05-27 LAB — HEMOGLOBIN A1C: Hgb A1c MFr Bld: 5.6 % (ref 4.6–6.5)

## 2024-05-27 MED ORDER — APIXABAN 5 MG PO TABS: 5.0000 mg | ORAL_TABLET | Freq: Two times a day (BID) | ORAL | 0 refills | Status: DC

## 2024-05-27 MED ORDER — ROSUVASTATIN CALCIUM 10 MG PO TABS: 10.0000 mg | ORAL_TABLET | Freq: Every day | ORAL | 0 refills | Status: AC

## 2024-05-27 NOTE — Patient Instructions (Signed)
 Atrial Fibrillation Atrial fibrillation (AFib) is a type of irregular or rapid heartbeat (arrhythmia). In AFib, the top part of the heart (atria) beats in an irregular pattern. This makes the heart unable to pump blood normally and effectively. The goal of treatment is to prevent blood clots from forming, control your heart rate, or restore your heartbeat to a normal rhythm. If this condition is not treated, it can cause serious problems, such as a weakened heart muscle (cardiomyopathy) or a stroke. What are the causes? This condition is often caused by medical conditions that damage the heart's electrical system. These include: High blood pressure (hypertension). This is the most common cause. Certain heart problems or conditions, such as heart failure, coronary artery disease, heart valve problems, or heart surgery. Diabetes. Overactive thyroid  (hyperthyroidism). Chronic kidney disease. Certain lung conditions, such as emphysema, pneumonia, or COPD. Obstructive sleep apnea. In some cases, the cause of this condition is not known. What increases the risk? This condition is more likely to develop in: Older adults. Athletes who do endurance exercise. People who have a family history of AFib. Males. People who are Caucasian. People who are obese. People who smoke or misuse alcohol. What are the signs or symptoms? Symptoms of this condition include: Fast or irregular heartbeats (palpitations). Discomfort or pain in your chest. Shortness of breath. Sudden light-headedness or weakness. Tiring easily during exercise or activity. Syncope (fainting). Sweating. In some cases, there are no symptoms. How is this diagnosed? Your health care provider may detect AFib when taking your pulse. If detected, this condition may be diagnosed with: An electrocardiogram (ECG) to check electrical signals of the heart. An ambulatory cardiac monitor to record your heart's activity for a few days. A  transthoracic echocardiogram (TTE) to create pictures of your heart. A transesophageal echocardiogram (TEE) to create even clearer pictures of your heart. A stress test to check your blood supply while you exercise. Imaging tests, such as a CT scan or chest X-ray. Blood tests. How is this treated? Treatment depends on underlying conditions and how you feel when you get AFib. This condition may be treated with: Medicines to prevent blood clots or to treat heart rate or heart rhythm problems. Electrical cardioversion to reset the heart's rhythm. A pacemaker to correct abnormal heart rhythm. Ablation to remove the heart tissue that sends abnormal signals. Left atrial appendage closure to seal the area where blood clots can form. In some cases, underlying conditions will be treated. Follow these instructions at home: Medicines Take over-the counter and prescription medicines only as told by your provider. Do not take any new medicines without talking to your provider. If you are taking blood thinners: Talk with your provider before taking aspirin  or NSAIDs. These medicines can raise your risk of bleeding. Take your medicines as told. Take them at the same time each day. Do not do things that could hurt or bruise you. Be careful to avoid falls. Wear an alert bracelet or carry a card that says that you take blood thinners. Lifestyle Do not use any products that contain nicotine or tobacco. These products include cigarettes, chewing tobacco, and vaping devices, such as e-cigarettes. If you need help quitting, ask your provider. Eat heart-healthy foods. Talk with a food expert (dietitian) to make an eating plan that is right for you. Exercise regularly as told by your provider. Do not drink alcohol. Lose weight if you are overweight. General instructions If you have obstructive sleep apnea, manage your condition as told by your provider.  Do not use diet pills unless your provider approves. Diet  pills can make heart problems worse. Keep all follow-up visits. Your provider will want to check your heart rate and rhythm regularly. Contact a health care provider if: You notice a change in the rate, rhythm, or strength of your heartbeat. You are taking a blood thinner and you notice more bruising. You tire more easily when you exercise or do heavy work. You have a sudden change in weight. Get help right away if:  You have chest pain. You have trouble breathing. You have side effects of blood thinners, such as blood in your vomit, poop (stool), or pee (urine), or bleeding that does not stop. You have any symptoms of a stroke. BE FAST is an easy way to remember the main warning signs of a stroke: B - Balance. Signs are dizziness, sudden trouble walking, or loss of balance. E - Eyes. Signs are trouble seeing or a sudden change in vision. F - Face. Signs are sudden weakness or numbness of the face, or the face or eyelid drooping on one side. A - Arms. Signs are weakness or numbness in an arm. This happens suddenly and usually on one side of the body. S - Speech.Signs are sudden trouble speaking, slurred speech, or trouble understanding what people say. T - Time. Time to call emergency services. Write down what time symptoms started. Other signs of a stroke, such as: A sudden, severe headache with no known cause. Nausea or vomiting. Seizure. These symptoms may be an emergency. Get help right away. Call 911. Do not wait to see if the symptoms will go away. Do not drive yourself to the hospital. This information is not intended to replace advice given to you by your health care provider. Make sure you discuss any questions you have with your health care provider. Document Revised: 02/01/2022 Document Reviewed: 02/01/2022 Elsevier Patient Education  2024 [employer].

## 2024-05-27 NOTE — Progress Notes (Unsigned)
 "  Subjective:  Patient ID: Christopher Dudley, male    DOB: 10/10/1934  Age: 89 y.o. MRN: [id number]  CC: Hypertension, Annual Exam, and Hyperlipidemia   HPI Christopher Dudley presents for a CPX and f/up   Discussed the use of AI scribe software for clinical note transcription with the patient, who gave verbal consent to proceed.  History of Present Illness Christopher Dudley is an 89 year old male who presents for a routine follow-up visit.  He remains active, engaging in walking and strength exercises at home, with plans to return to the gym in the new year. No chest pain, shortness of breath, dizziness, or lightheadedness during physical activity.  He sometimes experiences low blood pressure, with readings as low as 100/75 mmHg, but these episodes are brief. His blood pressure typically runs slightly under 120/80 mmHg. He has intentionally lost weight.  He reports an issue with his bladder not emptying as quickly as it used to, leading to more frequent urination over the past year. He drinks 6-8 glasses of water per day. There is no pain or difficulty urinating, and the frequency increases with higher fluid intake.     Outpatient Medications Prior to Visit  Medication Sig Dispense Refill   irbesartan  (AVAPRO ) 150 MG tablet TAKE 1 TABLET (150 MG TOTAL) BY MOUTH DAILY. SCHEDULE AN APPT FOR FURTHER REFILLS 90 tablet 1   rosuvastatin  (CRESTOR ) 10 MG tablet TAKE 1 TABLET BY MOUTH EVERY DAY 90 tablet 0   No facility-administered medications prior to visit.    ROS Review of Systems  Constitutional:  Positive for unexpected weight change (wt loss). Negative for appetite change, chills, diaphoresis and fatigue.  HENT: Negative.    Eyes: Negative.   Respiratory: Negative.  Negative for cough, chest tightness, shortness of breath and wheezing.   Cardiovascular:  Negative for chest pain, palpitations and leg swelling.  Gastrointestinal:  Negative for abdominal pain, constipation,  diarrhea, nausea and vomiting.  Endocrine: Negative.  Negative for polyuria.  Genitourinary:  Positive for frequency. Negative for difficulty urinating, dysuria and hematuria.  Musculoskeletal: Negative.  Negative for arthralgias and myalgias.  Skin: Negative.   Neurological: Negative.  Negative for dizziness, syncope, weakness, light-headedness and headaches.  Hematological:  Negative for adenopathy. Does not bruise/bleed easily.  Psychiatric/Behavioral: Negative.      Objective:  BP 122/64 (BP Location: Left Arm, Patient Position: Sitting, Cuff Size: Normal)   Pulse 84   Temp (!) 97.5 F (36.4 C) (Oral)   Resp 16   Ht 5' 6 (1.676 m)   Wt 179 lb 12.8 oz (81.6 kg)   SpO2 99%   BMI 29.02 kg/m   BP Readings from Last 3 Encounters:  05/27/24 122/64  10/25/23 130/72  03/06/23 136/80    Wt Readings from Last 3 Encounters:  05/27/24 179 lb 12.8 oz (81.6 kg)  01/04/24 199 lb (90.3 kg)  10/25/23 199 lb 3.2 oz (90.4 kg)    Physical Exam Vitals reviewed.  Constitutional:      Appearance: Normal appearance.  HENT:     Nose: Nose normal.     Mouth/Throat:     Mouth: Mucous membranes are moist.  Eyes:     General: No scleral icterus.    Conjunctiva/sclera: Conjunctivae normal.  Cardiovascular:     Rate and Rhythm: Normal rate. Rhythm irregularly irregular.     Heart sounds: No murmur heard.    No friction rub. No gallop.     Comments: EKG--- A fib with  PVC (new), 77 bpm No LVH Pulmonary:     Effort: Pulmonary effort is normal.     Breath sounds: No stridor. No wheezing, rhonchi or rales.  Abdominal:     General: Abdomen is flat.     Palpations: There is no mass.     Tenderness: There is no abdominal tenderness. There is no guarding.     Hernia: No hernia is present.  Musculoskeletal:     Cervical back: Neck supple.     Right lower leg: No edema.     Left lower leg: No edema.  Skin:    General: Skin is warm and dry.  Neurological:     General: No focal deficit  present.     Mental Status: He is alert. Mental status is at baseline.  Psychiatric:        Mood and Affect: Mood normal.        Behavior: Behavior normal.     Lab Results  Component Value Date   WBC 7.7 05/27/2024   HGB 14.7 05/27/2024   HCT 44.6 05/27/2024   PLT 245.0 05/27/2024   GLUCOSE 106 (H) 05/27/2024   CHOL 140 08/22/2023   TRIG 69 08/22/2023   HDL 62 08/22/2023   LDLCALC 71 08/22/2023   ALT 18 05/27/2024   AST 18 05/27/2024   NA 138 05/27/2024   K 4.3 05/27/2024   CL 102 05/27/2024   CREATININE 0.90 05/27/2024   BUN 11 05/27/2024   CO2 29 05/27/2024   TSH 0.86 05/27/2024   PSA 4.27 01/24/2019   HGBA1C 5.6 05/27/2024   MICROALBUR 0.9 08/22/2023    DG Lumbar Spine 1 View Result Date: 01/21/2020 CLINICAL DATA:  L3-5 laminectomy EXAM: LUMBAR SPINE - 1 VIEW COMPARISON:  MRI lumbar spine dated 12/24/2019 FINDINGS: Single lateral view of the lumbar spine demonstrates a needle at the L3-4 level, posterior to the superior endplate of the L4 vertebral body. IMPRESSION: Lumbar localization as above. Electronically Signed   By: Pinkie Pebbles M.D.   On: 01/21/2020 10:05     CHA2DS2-VASc Score =   3 {Click here to calculate score.  REFRESH note before signing. :1} This indicates a  % annual risk of stroke. The patient's score is based upon:      Estimated Creatinine Clearance: 58 mL/min (by C-G formula based on SCr of 0.9 mg/dL).   Assessment & Plan:  Need for immunization against influenza -     Flu vaccine HIGH DOSE PF(Fluzone Trivalent)  Urine frequency -     Basic metabolic panel with GFR; Future -     TSH; Future -     Urinalysis, Routine w reflex microscopic; Future -     Hemoglobin A1c; Future  Essential hypertension -     Basic metabolic panel with GFR; Future -     CBC with Differential/Platelet; Future -     TSH; Future -     Hepatic function panel; Future -     EKG 12-Lead  Hyperlipidemia with target LDL less than 130 -     TSH; Future -      Hepatic function panel; Future -     Rosuvastatin  Calcium ; Take 1 tablet (10 mg total) by mouth daily.  Dispense: 90 tablet; Refill: 0  Encounter for general adult medical examination with abnormal findings  Persistent atrial fibrillation (HCC) -     Ambulatory referral to Cardiology -     Apixaban; Take 1 tablet (5 mg total) by mouth 2 (two) times  daily.  Dispense: 180 tablet; Refill: 0     Follow-up: Return in about 6 months (around 11/25/2024).  Debby Molt, MD "

## 2024-05-30 ENCOUNTER — Ambulatory Visit: Attending: Internal Medicine | Admitting: Internal Medicine

## 2024-05-30 ENCOUNTER — Encounter: Payer: Self-pay | Admitting: Internal Medicine

## 2024-05-30 ENCOUNTER — Telehealth: Payer: Self-pay

## 2024-05-30 VITALS — BP 138/74 | HR 93 | Ht 66.0 in | Wt 182.2 lb

## 2024-05-30 DIAGNOSIS — E785 Hyperlipidemia, unspecified: Secondary | ICD-10-CM | POA: Diagnosis not present

## 2024-05-30 DIAGNOSIS — I493 Ventricular premature depolarization: Secondary | ICD-10-CM | POA: Diagnosis not present

## 2024-05-30 DIAGNOSIS — I491 Atrial premature depolarization: Secondary | ICD-10-CM

## 2024-05-30 DIAGNOSIS — I4819 Other persistent atrial fibrillation: Secondary | ICD-10-CM | POA: Diagnosis not present

## 2024-05-30 DIAGNOSIS — I10 Essential (primary) hypertension: Secondary | ICD-10-CM

## 2024-05-30 NOTE — Telephone Encounter (Signed)
 Copied from CRM [id number]. Topic: Clinical - Medication Question >> May 30, 2024  4:47 PM Jayma L wrote: Reason for CRM: patient stated pcp told him to stop taking a med , said he was unsure if he should take his other medicine, said he doesn't want to take it if he doesn't need too, advised I would send a message, best number for patient is [phone]

## 2024-05-30 NOTE — Progress Notes (Signed)
 " Cardiology Office Note:  .    Date:  05/30/2024  ID:  Christopher Dudley, DOB 05-Feb-1935, MRN [id number] PCP: Joshua Debby CROME, MD  [facility] Providers Cardiologist:  Stanly DELENA Leavens, MD     CC: AF eval Consulted for the evaluation of new AF at the behest of Dr. Joshua   History of Present Illness: .    Christopher Dudley is a 89 y.o. male with a history of PACs (prior EKG chart review) and PVCs who presents with a query of AF  [name] reports that he was told he was in atrial fibrillation during a routine visit, but he did not experience any symptoms. He is asymptomatic and can walk briskly for a mile and a half without experiencing symptoms such as chest pain or shortness of breath. A year ago, his EKG was reportedly normal, but he was recently told by another doctor that his EKG showed atrial fibrillation.  He has a history of hyperlipidemia and was started on Eliquis, a blood thinner, which he is hesitant to take due to negative experiences reported by friends. He was not informed about the prescription for Eliquis and expresses concerns about its use.  He has a history of back surgery for ruptured discs and stenosis at L3, L4, and L5, which has limited his ability to play golf. He experiences sciatica, affecting his physical activities, but remains active by walking regularly. He has lost weight since his back operation and is working on maintaining his activity level.  He lives alone, as his wife passed away over seven years ago, and he has no immediate family nearby. He monitors his blood pressure at home, which was 117/70 mmHg on the morning of the visit. He was previously on blood pressure medication but has recently stopped taking it after his last pill.  No chest pain, shortness of breath, or other symptoms related to atrial fibrillation. He reports sinus drainage and clear mucus production for about two months.  Discussed the use of AI scribe software for  clinical note transcription with the patient, who gave verbal consent to proceed.   Relevant histories: .  Social  - widowed - veteran; goes to [employer] ROS: As per HPI.   Studies Reviewed: .    EKG (05/27/24): Frequent premature atrial contractions and premature ventricular contractions; no evidence of atrial fibrillation or atrial flutter (Independently interpreted)  Physical Exam:    VS:  BP 138/74   Pulse 93   Ht 5' 6 (1.676 m)   Wt 182 lb 3.2 oz (82.6 kg)   SpO2 95%   BMI 29.41 kg/m    Wt Readings from Last 3 Encounters:  05/30/24 182 lb 3.2 oz (82.6 kg)  05/27/24 179 lb 12.8 oz (81.6 kg)  01/04/24 199 lb (90.3 kg)    Gen: No distress Ears:  Dempsey Sign Cardiac: No Rubs or Gallops, no Murmur, IRIR +2 radial pulses Respiratory: Clear to auscultation bilaterally, normal effort, normal  respiratory rate GI: Soft, nontender, non-distended  MS: No  edema;  moves all extremities Integument: Skin feels warm Neuro:  At time of evaluation, alert and oriented to person/place/time/situation  Psych: Normal affect, patient feels ok    ASSESSMENT AND PLAN: .    An EKG was ordered for irregular rhythm and shows PACs and PVCs  Evaluation of cardiac arrhythmia: PACs and PVCs EKG shows PACs and PVCs without evidence of atrial fibrillation or atrial flutter. He is asymptomatic and active, walking briskly for  a mile and a half daily. Differential diagnosis includes frequent premature atrial contractions and premature ventricular contractions. Concerns about atrial fibrillation due to age and elevated blood pressure, but current EKG does not support this diagnosis. He has a preference against Eliquis due to concerns about side effects experienced by friends. - Ordered a 30-day cardiac event monitor to evaluate for atrial fibrillation or atrial flutter. - If atrial fibrillation or atrial flutter is confirmed, will initiate anticoagulation with Xarelto due to preference against  Eliquis.  HTN - weaned off therapy (ARB) with controlled BP; reviewed ambulatory BP  HLD - continue statin  Spring f/u with me or my team  Stanly Leavens, MD FASE [facility] Cardiologist [facility]  [address] [place], [address] [place], [place] [address] [phone]  3:58 PM  "

## 2024-05-30 NOTE — Patient Instructions (Signed)
 Medication Instructions:  Your physician recommends that you continue on your current medications as directed. Please refer to the Current Medication list given to you today.  *If you need a refill on your cardiac medications before your next appointment, please call your pharmacy*  Lab Work: NONE  If you have labs (blood work) drawn today and your tests are completely normal, you will receive your results only by: MyChart Message (if you have MyChart) OR A paper copy in the mail If you have any lab test that is abnormal or we need to change your treatment, we will call you to review the results.  Testing/Procedures: Your physician has requested that you wear a heart monitor.   Follow-Up: At [facility], you and your health needs are our priority.  As part of our continuing mission to provide you with exceptional heart care, our providers are all part of one team.  This team includes your primary Cardiologist (physician) and Advanced Practice Providers or APPs (Physician Assistants and Nurse Practitioners) who all work together to provide you with the care you need, when you need it.  Your next appointment:   3 month(s)  Provider:   One of our Advanced Practice Providers (APPs): Morse Clause, PA-C  Lamarr Satterfield, NP Miriam Shams, NP  Olivia Pavy, PA-C Josefa Beauvais, NP  Leontine Salen, PA-C Orren Fabry, PA-C  Dolliver, PA-C Ernest Dick, NP  Damien Braver, NP Jon Hails, PA-C  Waddell Donath, PA-C    Dayna Dunn, PA-C  Scott Weaver, PA-C Lum Louis, NP Katlyn West, NP Callie Goodrich, PA-C  Xika Zhao, NP Sheng Haley, PA-C    Kathleen Johnson, PA-C     Other Instructions [employer] Cardiac Event Monitor Instructions  Your physician has requested you wear your cardiac event monitor for _____ days, (1-30). [employer] may call or text to confirm a shipping address. The monitor will be sent to a land address via UPS. [employer] will not ship a monitor to a PO  BOX. It typically takes 3-5 days to receive your monitor after it has been enrolled. [employer] will assist with USPS tracking if your package is delayed. The telephone number for [employer] is [phone]. Once you have received your monitor, please review the enclosed instructions. Instruction tutorials can also be viewed under help and settings on the enclosed cell phone. Your monitor has already been registered assigning a specific monitor serial # to you.  Billing and Self Pay Discount Information  [employer] has been provided the insurance information we had on file for you.  If your insurance has been updated, please call [employer] at [phone] to provide them with your updated insurance information.   [employer] offers a discounted Self Pay option for patients who have insurance that does not cover their cardiac event monitor or patients without insurance.  The discounted cost of a Self Pay Cardiac Event Monitor would be $225.00 , if the patient contacts [employer] at [phone] within 7 days of applying the monitor to make payment arrangements.  If the patient does not contact [employer] within 7 days of applying the monitor, the cost of the cardiac event monitor will be $350.00.  Applying the monitor  Remove cell phone from case and turn it on. The cell phone works as it consultant and needs to be within [employer] of you at all times. The cell phone will need to be charged on a daily basis. We recommend you plug the cell phone into the enclosed charger at your bedside table every  night.  Monitor batteries: You will receive two monitor batteries labelled #1 and #2. These are your recorders. Plug battery #2 onto the second connection on the enclosed charger. Keep one battery on the charger at all times. This will keep the monitor battery deactivated. It will also keep it fully charged for when you need to switch your monitor batteries. A small light will be blinking on the  battery emblem when it is charging. The light on the battery emblem will remain on when the battery is fully charged.  Open package of a Monitor strip. Insert battery #1 into black hood on strip and gently squeeze monitor battery onto connection as indicated in instruction booklet. Set aside while preparing skin.  Choose location for your strip, vertical or horizontal, as indicated in the instruction booklet. Shave to remove all hair from location. There cannot be any lotions, oils, powders, or colognes on skin where monitor is to be applied. Wipe skin clean with enclosed Saline wipe. Dry skin completely.  Peel paper labeled #1 off the back of the Monitor strip exposing the adhesive. Place the monitor on the chest in the vertical or horizontal position shown in the instruction booklet. One arrow on the monitor strip must be pointing upward. Carefully remove paper labeled #2, attaching remainder of strip to your skin. Try not to create any folds or wrinkles in the strip as you apply it.  Firmly press and release the circle in the center of the monitor battery. You will hear a small beep. This is turning the monitor battery on. The heart emblem on the monitor battery will light up every 5 seconds if the monitor battery in turned on and connected to the patient securely. Do not push and hold the circle down as this turns the monitor battery off. The cell phone will locate the monitor battery. A screen will appear on the cell phone checking the connection of your monitor strip. This may read poor connection initially but change to good connection within the next minute. Once your monitor accepts the connection you will hear a series of 3 beeps followed by a climbing crescendo of beeps. A screen will appear on the cell phone showing the two monitor strip placement options. Touch the picture that demonstrates where you applied the monitor strip.  Your monitor strip and battery are waterproof. You  are able to shower, bathe, or swim with the monitor on. They just ask you do not submerge deeper than 3 feet underwater. We recommend removing the monitor if you are swimming in a lake, river, or ocean.  Your monitor battery will need to be switched to a fully charged monitor battery approximately once a week. The cell phone will alert you of an action which needs to be made.  On the cell phone, tap for details to reveal connection status, monitor battery status, and cell phone battery status. The green dots indicates your monitor is in good status. A red dot indicates there is something that needs your attention.  To record a symptom, click the circle on the monitor battery. In 30-60 seconds a list of symptoms will appear on the cell phone. Select your symptom and tap save. Your monitor will record a sustained or significant arrhythmia regardless of you clicking the button. Some patients do not feel the heart rhythm irregularities. [employer] will notify us  of any serious or critical events.  Refer to instruction booklet for instructions on switching batteries, changing strips, the Do not disturb or Pause features,  or any additional questions.  Call [employer] at [phone], to confirm your monitor is transmitting and record your baseline. They will answer any questions you may have regarding the monitor instructions at that time.  Returning the monitor to [employer]  Place all equipment back into blue box. Peel off strip of paper to expose adhesive and close box securely. There is a prepaid UPS shipping label on this box. Drop in a UPS drop box, or at a UPS facility like [employer]. You may also contact [employer] to arrange UPS to pick up monitor package at your home.

## 2024-06-02 ENCOUNTER — Other Ambulatory Visit: Payer: Self-pay | Admitting: Internal Medicine

## 2024-06-02 DIAGNOSIS — E785 Hyperlipidemia, unspecified: Secondary | ICD-10-CM

## 2024-06-02 DIAGNOSIS — I10 Essential (primary) hypertension: Secondary | ICD-10-CM

## 2024-06-02 DIAGNOSIS — I493 Ventricular premature depolarization: Secondary | ICD-10-CM

## 2024-06-02 DIAGNOSIS — I4819 Other persistent atrial fibrillation: Secondary | ICD-10-CM

## 2024-06-02 DIAGNOSIS — I491 Atrial premature depolarization: Secondary | ICD-10-CM

## 2024-06-04 ENCOUNTER — Telehealth: Payer: Self-pay

## 2024-06-04 NOTE — Telephone Encounter (Signed)
 Patient has been made aware that he isn't supposed to be taking Irberartan. He is supposed to be taking his Crestor . He made it very clear that he isn't going to take the Eliquis . I have refilled his Crestor  and he gave me a verbal understanding.

## 2024-06-04 NOTE — Telephone Encounter (Signed)
 Patient called back I missed his call. I returned his call and he missed me. LMTRC . A new telephone call was opened but I am going to close that encounter and document in this one.

## 2024-06-04 NOTE — Telephone Encounter (Signed)
 Copied from CRM #[id number]. Topic: General - Other >> Jun 04, 2024 11:02 AM Berneda FALCON wrote: Reason for CRM: Patient is returning phone call to [facility] about his medication and if he should or should not be taking it. I called CAL to see if she was available and spoke to [place] who stated she was unavailable at this time and would give him a callback.  Patient callback is [phone] (home)

## 2024-06-04 NOTE — Telephone Encounter (Signed)
 Unable to reach patient. LMTRC

## 2024-06-04 NOTE — Telephone Encounter (Signed)
 Copied from CRM [id number]. Topic: General - Other >> Jun 04, 2024  2:28 PM Eva FALCON wrote: Reason for CRM: Pt returning call to [facility]. Attempted to transfer to clinic, spoke to [place] and stated Jaz was unavailable at the time. Please call patient at his cell phone so he won't miss call again [phone].

## 2024-06-04 NOTE — Telephone Encounter (Signed)
 Noted in another telephone encounter. Will be closing this one.

## 2024-06-05 ENCOUNTER — Telehealth: Payer: Self-pay | Admitting: Internal Medicine

## 2024-06-05 NOTE — Telephone Encounter (Signed)
 Pt requesting a nurse visit to help with his heart monitor.

## 2024-06-05 NOTE — Telephone Encounter (Signed)
 Patient scheduled to have his [employer] event monitor applied on 06/06/24.

## 2024-06-06 ENCOUNTER — Ambulatory Visit: Attending: Internal Medicine

## 2024-06-06 DIAGNOSIS — I491 Atrial premature depolarization: Secondary | ICD-10-CM

## 2024-06-06 DIAGNOSIS — I493 Ventricular premature depolarization: Secondary | ICD-10-CM

## 2024-06-06 NOTE — Progress Notes (Unsigned)
 [employer]ips [id number] mailed to patient 06/02/24 and applied in office on 06/06/24.

## 2024-06-09 ENCOUNTER — Other Ambulatory Visit (HOSPITAL_COMMUNITY): Payer: Self-pay

## 2024-06-09 ENCOUNTER — Telehealth: Payer: Self-pay | Admitting: Internal Medicine

## 2024-06-09 DIAGNOSIS — I4891 Unspecified atrial fibrillation: Secondary | ICD-10-CM

## 2024-06-09 MED ORDER — RIVAROXABAN 20 MG PO TABS
20.0000 mg | ORAL_TABLET | Freq: Every day | ORAL | 1 refills | Status: AC
  Filled 2024-06-09: qty 30, 30d supply, fill #0

## 2024-06-09 NOTE — Telephone Encounter (Signed)
 Called pt advised of MD recommendation: Since atrial fibrillation or atrial flutter is confirmed, will initiate anticoagulation with Xarelto  due to preference against Eliquis .  CBC in two weeks.  Monitor will help us  assess his AF burden.   Christopher Leavens, MD FASE [facility]  Pt is agreeable to plan all questions answered.

## 2024-06-09 NOTE — Telephone Encounter (Signed)
"  ° °  Cardiac Monitor Alert  Date of alert:  06/09/2024   Patient Name: Christopher Dudley  DOB: Feb 24, 1935  MRN: [id number]   [facility] Cardiologist: Stanly DELENA Leavens, MD  [facility] EP:  None    Monitor Information: Cardiac Event Monitor [[employer]]  Reason:  Afib Ordering provider:  Chandrasekhar   Alert Atrial Fibrillation/Flutter This is the 1st alert for this rhythm.  The patient has a hx of Atrial Fibrillation/Flutter.    Anticoagulation medication as of 06/09/2024           apixaban  (ELIQUIS ) 5 MG TABS tablet Take 1 tablet (5 mg total) by mouth 2 (two) times daily.       Next Cardiology Appointment   Date:  08/13/24  Provider:  Kenzie Campbell  The patient was contacted today.  He is asymptomatic.  Routing to [employer] for review. Covering notified.   Other:  Reported by [facility]- 1/9 1:47pm Afib rate at 133 autotrigger  Report NOT currently on onbase.   Corean LOISE Ferri, RN  06/09/2024 11:38 AM   "

## 2024-06-09 NOTE — Telephone Encounter (Signed)
 Caller [facility]) is reporting urgent EKG results.

## 2024-06-09 NOTE — Addendum Note (Signed)
 Addended by: RANDY HAMP SAILOR on: 06/09/2024 05:35 PM   Modules accepted: Orders

## 2024-06-19 ENCOUNTER — Telehealth: Payer: Self-pay | Admitting: Internal Medicine

## 2024-06-19 NOTE — Telephone Encounter (Signed)
Urgent EKG Results

## 2024-06-19 NOTE — Telephone Encounter (Signed)
 [name] calling from [place] with urgent EKG.  06/18/24 1637:  Afib HR 184 bpm 75 seconds  Will send to Dr Santo for further recommendations. Will also send to DOD and covering

## 2024-06-19 NOTE — Telephone Encounter (Signed)
"  ° °  Cardiac Monitor Alert  Date of alert:  06/19/2024 250pm Call to triage: 06/19/2024 4:07pm  Patient Name: Christopher Dudley  DOB: August 17, 1934  MRN: [id number]   [facility] Cardiologist: Stanly DELENA Leavens, MD  [facility] EP:  None    Monitor Information: Cardiac Event Monitor [[employer]]  Reason:  Evaluate Afib/Aflutter Ordering provider:  Primus, MD   Alert Atrial Fibrillation/Flutter This is the 3rd alert for this rhythm.  The patient has a hx of Atrial Fibrillation/Flutter.    Anticoagulation medication as of 06/19/2024           rivaroxaban  (XARELTO ) 20 MG TABS tablet Take 1 tablet (20 mg total) by mouth daily with supper.       Next Cardiology Appointment   Date:  08/13/2024  Provider:  Miriam Shams, NP  The patient was contacted today.  He is asymptomatic. Called pt and states he was walking outside uphill and downhill and was truckin it. Denies any symptoms. Pt cancelled afib clinic appt earlier today, forwarded to DOD [place].  since no symptoms can check with DR Primus - limited episode   [name], LPN  8/77/7973 5:84 PM   "

## 2024-06-19 NOTE — Telephone Encounter (Addendum)
 Spoke with patient regarding EKG results from yesterday. Pt stated his monitor quit working yesterday and called [place] monitor and they had him tap the monitor several times, turn it off and turn back on and keep taping it. Pt also states this happened around 4:30pm, same time as alert. Pt denies any palpitations, SOB or any other symptoms. Alert called in this monitor from [place] monitor. Will update doctors previously alerted with update.

## 2024-06-19 NOTE — Telephone Encounter (Signed)
 Patient stated he did not need to see the [facility] and noted he believes the high Hr readings were due him working with [place] to get his monitor working again.  Patient stated he also wears a smart watch and his HR only registered 90.  Patient wants a call back to confirm if he should still go to [facility].

## 2024-06-19 NOTE — Telephone Encounter (Signed)
 [facility] calling with abnormal EKG results. Please advise.

## 2024-06-19 NOTE — Telephone Encounter (Signed)
 Spoke with patient and shared message from Dr. Lavona. [facility] appt cancelled.  Patient states he felt fine during the episode recorded yesterday afternoon. Other than some agitation with trying to get his heart monitor working again he had no symptoms. Denies CP, SOB, palpitations, or dizziness.   Episode recorded lasted 75 seconds per monitor report. Patient states at the same time his smart watch showed his HR only reaching a max of 90 bpm that day. He does not feel he needs to be seen by a provider at this time.  Will forward to Dr. Lavona and Dr. Santo to review.

## 2024-06-20 ENCOUNTER — Ambulatory Visit (HOSPITAL_COMMUNITY): Admitting: Physician Assistant

## 2024-06-20 NOTE — Telephone Encounter (Signed)
 Spoke with pt regarding Dr. Denver suggestions. Pt opted to keep appointment on 3/18 with Elaine, NP and will let us  know in the meantime if he has any symptoms or issues.

## 2024-06-25 LAB — CBC
Hematocrit: 46.1 % (ref 37.5–51.0)
Hemoglobin: 14.6 g/dL (ref 13.0–17.7)
MCH: 29.9 pg (ref 26.6–33.0)
MCHC: 31.7 g/dL (ref 31.5–35.7)
MCV: 94 fL (ref 79–97)
Platelets: 238 10*3/uL (ref 150–450)
RBC: 4.89 x10E6/uL (ref 4.14–5.80)
RDW: 12 % (ref 11.6–15.4)
WBC: 7.7 10*3/uL (ref 3.4–10.8)

## 2024-06-25 NOTE — Telephone Encounter (Signed)
 Called pt reports is taking Xarelto  as ordered will be having lab work drawn today.  Denies palpitations expresses feels fine.  All questions answered.

## 2024-06-26 ENCOUNTER — Ambulatory Visit: Payer: Self-pay

## 2024-08-13 ENCOUNTER — Ambulatory Visit: Admitting: Emergency Medicine
